# Patient Record
Sex: Female | Born: 1960 | Hispanic: Yes | Marital: Married | Smoking: Former smoker
Health system: Southern US, Community
[De-identification: ages and names within clinical notes are randomized; demographics above are authoritative.]

## PROBLEM LIST (undated history)

## (undated) DIAGNOSIS — F419 Anxiety disorder, unspecified: Secondary | ICD-10-CM

## (undated) DIAGNOSIS — F32A Depression, unspecified: Secondary | ICD-10-CM

## (undated) DIAGNOSIS — R7303 Prediabetes: Secondary | ICD-10-CM

## (undated) DIAGNOSIS — K219 Gastro-esophageal reflux disease without esophagitis: Secondary | ICD-10-CM

## (undated) DIAGNOSIS — E039 Hypothyroidism, unspecified: Secondary | ICD-10-CM

## (undated) DIAGNOSIS — I10 Essential (primary) hypertension: Secondary | ICD-10-CM

## (undated) HISTORY — PX: ABDOMINAL HYSTERECTOMY: SHX81

---

## 2010-09-12 ENCOUNTER — Emergency Department: Payer: Self-pay | Admitting: Emergency Medicine

## 2010-10-17 ENCOUNTER — Emergency Department: Payer: Self-pay | Admitting: Emergency Medicine

## 2011-07-30 ENCOUNTER — Ambulatory Visit: Payer: Self-pay | Admitting: Family Medicine

## 2011-09-27 ENCOUNTER — Emergency Department: Payer: Self-pay | Admitting: Emergency Medicine

## 2011-09-27 LAB — COMPREHENSIVE METABOLIC PANEL
BUN: 21 mg/dL — ABNORMAL HIGH (ref 7–18)
Calcium, Total: 9 mg/dL (ref 8.5–10.1)
Chloride: 104 mmol/L (ref 98–107)
Co2: 27 mmol/L (ref 21–32)
Creatinine: 0.9 mg/dL (ref 0.60–1.30)
EGFR (African American): >60
EGFR (Non-African Amer.): >60
Potassium: 3.5 mmol/L (ref 3.5–5.1)
SGOT(AST): 25 U/L (ref 15–37)
SGPT (ALT): 24 U/L (ref 12–78)
Total Protein: 7.8 g/dL (ref 6.4–8.2)

## 2011-09-27 LAB — CBC
HCT: 35.8 % (ref 35.0–47.0)
HGB: 12.5 g/dL (ref 12.0–16.0)
MCH: 32.1 pg (ref 26.0–34.0)
MCHC: 34.9 g/dL (ref 32.0–36.0)
MCV: 92 fL (ref 80–100)
RBC: 3.88 10*6/uL (ref 3.80–5.20)
RDW: 14.5 % (ref 11.5–14.5)

## 2011-09-28 LAB — TSH: Thyroid Stimulating Horm: 3.72 u[IU]/mL

## 2014-01-26 DIAGNOSIS — W19XXXA Unspecified fall, initial encounter: Secondary | ICD-10-CM | POA: Insufficient documentation

## 2014-01-31 DIAGNOSIS — M65811 Other synovitis and tenosynovitis, right shoulder: Secondary | ICD-10-CM | POA: Insufficient documentation

## 2014-01-31 DIAGNOSIS — M65911 Unspecified synovitis and tenosynovitis, right shoulder: Secondary | ICD-10-CM

## 2014-01-31 HISTORY — DX: Unspecified synovitis and tenosynovitis, right shoulder: M65.911

## 2014-08-15 ENCOUNTER — Ambulatory Visit
Admission: RE | Admit: 2014-08-15 | Discharge: 2014-08-15 | Disposition: A | Discharge status: Home / Self Care | Payer: Self-pay | Source: Ambulatory Visit | Attending: Oncology | Admitting: Oncology

## 2014-08-15 ENCOUNTER — Ambulatory Visit: Payer: Self-pay | Attending: Oncology

## 2014-08-15 VITALS — BP 125/80 | HR 68 | Temp 96.9°F | Resp 18 | Ht 64.17 in | Wt 233.0 lb

## 2014-08-15 DIAGNOSIS — Z0000 Encounter for general adult medical examination without abnormal findings: Secondary | ICD-10-CM

## 2014-08-15 NOTE — Progress Notes (Signed)
Subjective:     Patient ID: Felicia Durham, female   DOB: 12/03/1960, 54 y.o.   MRN: [id number]  HPI   Review of Systems     Objective:   Physical Exam  Pulmonary/Chest: Right breast exhibits no inverted nipple, no mass, no nipple discharge, no skin change and no tenderness. Left breast exhibits no inverted nipple, no mass, no nipple discharge, no skin change and no tenderness. Breasts are symmetrical.       Assessment:     [id number] yraer ol hispanic patient presents for [facility] clinic visit.  Patient screened, and meets [facility] eligibility.  Patient does not have insurance, Medicare or Medicaid.  Handout given on Affordable Care Act.  CBE unremarkable.  Instructed patient on breast self-exam using teach back method.  Patient is self employed Advertising copywriterhousekeeper.  She is applying for job in [place] when she leaves here.     Plan:     Sent for bilateral screening mammogram.  Felicia BellowMaritza Durham interpreted exam.

## 2014-08-16 ENCOUNTER — Other Ambulatory Visit: Payer: Self-pay

## 2014-08-16 DIAGNOSIS — N63 Unspecified lump in unspecified breast: Secondary | ICD-10-CM

## 2014-08-16 NOTE — Progress Notes (Signed)
Screening mammogram Birads 0 possible right breast mass.  Order for right breast diagnostic mammogram with tomosynthesis, and right breast ultrasound entered.

## 2014-08-19 ENCOUNTER — Other Ambulatory Visit: Payer: Self-pay

## 2014-08-19 ENCOUNTER — Ambulatory Visit
Admission: RE | Admit: 2014-08-19 | Discharge: 2014-08-19 | Disposition: A | Discharge status: Home / Self Care | Payer: Self-pay | Source: Ambulatory Visit | Attending: Oncology | Admitting: Oncology

## 2014-08-19 DIAGNOSIS — N63 Unspecified lump in unspecified breast: Secondary | ICD-10-CM

## 2014-08-19 DIAGNOSIS — R928 Other abnormal and inconclusive findings on diagnostic imaging of breast: Secondary | ICD-10-CM | POA: Insufficient documentation

## 2014-08-24 NOTE — Progress Notes (Signed)
Letter mailed from [facility] to notify of normal mammogram results.  Patient to return in one year for annual screening.  Copy to [facility]. 

## 2014-12-15 ENCOUNTER — Encounter
Admission: RE | Admit: 2014-12-15 | Discharge: 2014-12-15 | Disposition: A | Discharge status: Home / Self Care | Payer: Self-pay | Source: Ambulatory Visit | Attending: Surgery | Admitting: Surgery

## 2014-12-15 DIAGNOSIS — Z01810 Encounter for preprocedural cardiovascular examination: Secondary | ICD-10-CM | POA: Insufficient documentation

## 2014-12-15 DIAGNOSIS — Z01812 Encounter for preprocedural laboratory examination: Secondary | ICD-10-CM | POA: Insufficient documentation

## 2014-12-15 HISTORY — DX: Gastro-esophageal reflux disease without esophagitis: K21.9

## 2014-12-15 HISTORY — DX: Anxiety disorder, unspecified: F41.9

## 2014-12-15 HISTORY — DX: Hypothyroidism, unspecified: E03.9

## 2014-12-15 HISTORY — DX: Essential (primary) hypertension: I10

## 2014-12-15 LAB — CBC
HCT: 39.9 % (ref 35.0–47.0)
Hemoglobin: 13.1 g/dL (ref 12.0–16.0)
MCH: 29.5 pg (ref 26.0–34.0)
MCHC: 32.8 g/dL (ref 32.0–36.0)
MCV: 89.8 fL (ref 80.0–100.0)
PLATELETS: 270 10*3/uL (ref 150–440)
RBC: 4.44 MIL/uL (ref 3.80–5.20)
RDW: 13.3 % (ref 11.5–14.5)
WBC: 7.5 10*3/uL (ref 3.6–11.0)

## 2014-12-15 LAB — BASIC METABOLIC PANEL
Anion gap: 7 (ref 5–15)
BUN: 21 mg/dL — AB (ref 6–20)
CALCIUM: 9.3 mg/dL (ref 8.9–10.3)
CO2: 29 mmol/L (ref 22–32)
CREATININE: 0.53 mg/dL (ref 0.44–1.00)
Chloride: 107 mmol/L (ref 101–111)
Glucose, Bld: 96 mg/dL (ref 65–99)
Potassium: 4.1 mmol/L (ref 3.5–5.1)
SODIUM: 143 mmol/L (ref 135–145)

## 2014-12-15 NOTE — Patient Instructions (Signed)
  Your procedure is scheduled on: Tuesday 12/27/2014 Report to Day Surgery. [facility] To find out your arrival time please call [phone] between 1PM - 3PM on Monday 12/26/2014.  Remember: Instructions that are not followed completely may result in serious medical risk, up to and including death, or upon the discretion of your surgeon and anesthesiologist your surgery may need to be rescheduled.    __X__ 1. Do not eat food or drink liquids after midnight. No gum chewing or hard candies.     __X__ 2. No Alcohol for 24 hours before or after surgery.   ____ 3. Bring all medications with you on the day of surgery if instructed.    __X__ 4. Notify your doctor if there is any change in your medical condition     (cold, fever, infections).     Do not wear jewelry, make-up, hairpins, clips or nail polish.  Do not wear lotions, powders, or perfumes.   Do not shave 48 hours prior to surgery. Men may shave face and neck.  Do not bring valuables to the hospital.    [facility] any belongings or valuables.               Contacts, dentures or bridgework may not be worn into surgery.  Leave your suitcase in the car. After surgery it may be brought to your room.  For patients admitted to the hospital, discharge time is determined by your                treatment team.   Patients discharged the day of surgery will not be allowed to drive home.   Please read over the following fact sheets that you were given:   Surgical Site Infection Prevention   __X__ : TAKE THESE MEDICATIONS WITH A SIP OF WATER THE MORNING OF SURGERY  1. LEVOTHYROXINE  2. CITALOPRAM  3.   4.  5.  6.  ____ Fleet Enema (as directed)   __X__ Use CHG Soap as directed  ____ Use inhalers on the day of surgery  ____ Stop metformin 2 days prior to surgery    ____ Take 1/2 of usual insulin dose the night before surgery and none on the morning of surgery.   ____ Stop  Coumadin/Plavix/aspirin on   __X__ Stop Anti-inflammatories on STOP ADVIL TODAY   ____ Stop supplements until after surgery.    ____ Bring C-Pap to the hospital.

## 2014-12-27 ENCOUNTER — Ambulatory Visit
Admission: RE | Admit: 2014-12-27 | Discharge: 2014-12-27 | Disposition: A | Discharge status: Home / Self Care | Payer: Worker's Compensation | Source: Ambulatory Visit | Attending: Surgery | Admitting: Surgery

## 2014-12-27 ENCOUNTER — Ambulatory Visit: Payer: Worker's Compensation | Admitting: Anesthesiology

## 2014-12-27 ENCOUNTER — Encounter
Admission: RE | Disposition: A | Discharge status: Home / Self Care | Payer: Self-pay | Source: Ambulatory Visit | Attending: Surgery

## 2014-12-27 ENCOUNTER — Encounter: Payer: Self-pay | Admitting: *Deleted

## 2014-12-27 DIAGNOSIS — M75101 Unspecified rotator cuff tear or rupture of right shoulder, not specified as traumatic: Secondary | ICD-10-CM | POA: Insufficient documentation

## 2014-12-27 DIAGNOSIS — Z79899 Other long term (current) drug therapy: Secondary | ICD-10-CM | POA: Diagnosis not present

## 2014-12-27 DIAGNOSIS — E039 Hypothyroidism, unspecified: Secondary | ICD-10-CM | POA: Diagnosis not present

## 2014-12-27 DIAGNOSIS — M7541 Impingement syndrome of right shoulder: Secondary | ICD-10-CM | POA: Diagnosis not present

## 2014-12-27 DIAGNOSIS — M7521 Bicipital tendinitis, right shoulder: Secondary | ICD-10-CM | POA: Insufficient documentation

## 2014-12-27 DIAGNOSIS — Z87891 Personal history of nicotine dependence: Secondary | ICD-10-CM | POA: Insufficient documentation

## 2014-12-27 HISTORY — PX: SHOULDER ARTHROSCOPY: SHX128

## 2014-12-27 SURGERY — ARTHROSCOPY, SHOULDER
Anesthesia: Regional | Laterality: Right | Wound class: Clean

## 2014-12-27 MED ORDER — ONDANSETRON HCL 4 MG/2ML IJ SOLN
INTRAMUSCULAR | Status: DC | PRN
  Administered 2014-12-27: 4 mg via INTRAVENOUS

## 2014-12-27 MED ORDER — EPHEDRINE SULFATE 50 MG/ML IJ SOLN
INTRAMUSCULAR | Status: DC | PRN
  Administered 2014-12-27: 10 mg via INTRAVENOUS
  Administered 2014-12-27: 5 mg via INTRAVENOUS

## 2014-12-27 MED ORDER — DEXAMETHASONE SODIUM PHOSPHATE 4 MG/ML IJ SOLN
INTRAMUSCULAR | Status: DC | PRN
  Administered 2014-12-27: 5 mg via INTRAVENOUS

## 2014-12-27 MED ORDER — FENTANYL CITRATE (PF) 100 MCG/2ML IJ SOLN
INTRAMUSCULAR | Status: AC
  Filled 2014-12-27: qty 2

## 2014-12-27 MED ORDER — POTASSIUM CHLORIDE IN NACL 20-0.9 MEQ/L-% IV SOLN: INTRAVENOUS | Status: DC

## 2014-12-27 MED ORDER — LACTATED RINGERS IV SOLN
INTRAVENOUS | Status: DC | PRN
  Administered 2014-12-27: 2 mL

## 2014-12-27 MED ORDER — BUPIVACAINE-EPINEPHRINE (PF) 0.5% -1:200000 IJ SOLN
INTRAMUSCULAR | Status: DC | PRN
  Administered 2014-12-27: 30 mL

## 2014-12-27 MED ORDER — MIDAZOLAM HCL 5 MG/5ML IJ SOLN
INTRAMUSCULAR | Status: AC
  Administered 2014-12-27: 1 mg
  Filled 2014-12-27: qty 5

## 2014-12-27 MED ORDER — FAMOTIDINE 20 MG PO TABS
20.0000 mg | ORAL_TABLET | Freq: Once | ORAL | Status: AC
  Administered 2014-12-27: 20 mg via ORAL

## 2014-12-27 MED ORDER — KETOROLAC TROMETHAMINE 30 MG/ML IJ SOLN
INTRAMUSCULAR | Status: DC | PRN
  Administered 2014-12-27: 30 mg via INTRAVENOUS

## 2014-12-27 MED ORDER — LIDOCAINE HCL (CARDIAC) 20 MG/ML IV SOLN
INTRAVENOUS | Status: DC | PRN
  Administered 2014-12-27: 100 mg via INTRAVENOUS

## 2014-12-27 MED ORDER — METOCLOPRAMIDE HCL 5 MG/ML IJ SOLN: 5.0000 mg | Freq: Three times a day (TID) | INTRAMUSCULAR | Status: DC | PRN

## 2014-12-27 MED ORDER — CEFAZOLIN SODIUM-DEXTROSE 2-3 GM-% IV SOLR
INTRAVENOUS | Status: AC
  Administered 2014-12-27: 2 g via INTRAVENOUS
  Filled 2014-12-27: qty 50

## 2014-12-27 MED ORDER — SUGAMMADEX SODIUM 200 MG/2ML IV SOLN
INTRAVENOUS | Status: DC | PRN
  Administered 2014-12-27: 200 mg via INTRAVENOUS

## 2014-12-27 MED ORDER — ONDANSETRON HCL 4 MG PO TABS: 4.0000 mg | ORAL_TABLET | Freq: Four times a day (QID) | ORAL | Status: DC | PRN

## 2014-12-27 MED ORDER — FENTANYL CITRATE (PF) 100 MCG/2ML IJ SOLN
25.0000 ug | INTRAMUSCULAR | Status: DC | PRN
  Administered 2014-12-27 (×5): 25 ug via INTRAVENOUS

## 2014-12-27 MED ORDER — ACETAMINOPHEN 10 MG/ML IV SOLN
INTRAVENOUS | Status: AC
  Filled 2014-12-27: qty 100

## 2014-12-27 MED ORDER — BUPIVACAINE-EPINEPHRINE (PF) 0.5% -1:200000 IJ SOLN
INTRAMUSCULAR | Status: AC
  Filled 2014-12-27: qty 30

## 2014-12-27 MED ORDER — FENTANYL CITRATE (PF) 100 MCG/2ML IJ SOLN
INTRAMUSCULAR | Status: AC
  Administered 2014-12-27: 50 ug
  Filled 2014-12-27: qty 2

## 2014-12-27 MED ORDER — ACETAMINOPHEN 10 MG/ML IV SOLN
INTRAVENOUS | Status: DC | PRN
  Administered 2014-12-27: 1000 mg via INTRAVENOUS

## 2014-12-27 MED ORDER — OXYCODONE HCL 5 MG PO TABS: 5.0000 mg | ORAL_TABLET | ORAL | Status: DC | PRN

## 2014-12-27 MED ORDER — ROCURONIUM BROMIDE 100 MG/10ML IV SOLN
INTRAVENOUS | Status: DC | PRN
  Administered 2014-12-27: 30 mg via INTRAVENOUS

## 2014-12-27 MED ORDER — ONDANSETRON HCL 4 MG/2ML IJ SOLN: 4.0000 mg | Freq: Once | INTRAMUSCULAR | Status: DC | PRN

## 2014-12-27 MED ORDER — EPINEPHRINE HCL 1 MG/ML IJ SOLN
INTRAMUSCULAR | Status: AC
  Filled 2014-12-27: qty 2

## 2014-12-27 MED ORDER — PROPOFOL 10 MG/ML IV BOLUS
INTRAVENOUS | Status: DC | PRN
  Administered 2014-12-27: 150 mg via INTRAVENOUS

## 2014-12-27 MED ORDER — METOCLOPRAMIDE HCL 10 MG PO TABS: 5.0000 mg | ORAL_TABLET | Freq: Three times a day (TID) | ORAL | Status: DC | PRN

## 2014-12-27 MED ORDER — FAMOTIDINE 20 MG PO TABS
ORAL_TABLET | ORAL | Status: AC
  Filled 2014-12-27: qty 1

## 2014-12-27 MED ORDER — DEXTROSE 5 % IV SOLN: 2000.0000 mg | Freq: Once | INTRAVENOUS | Status: DC

## 2014-12-27 MED ORDER — FENTANYL CITRATE (PF) 100 MCG/2ML IJ SOLN: 50.0000 ug | Freq: Once | INTRAMUSCULAR | Status: DC

## 2014-12-27 MED ORDER — OXYCODONE HCL 5 MG PO TABS
5.0000 mg | ORAL_TABLET | ORAL | Status: DC | PRN
  Administered 2014-12-27: 5 mg via ORAL

## 2014-12-27 MED ORDER — FENTANYL CITRATE (PF) 100 MCG/2ML IJ SOLN
INTRAMUSCULAR | Status: DC | PRN
  Administered 2014-12-27: 50 ug via INTRAVENOUS

## 2014-12-27 MED ORDER — CEFAZOLIN SODIUM-DEXTROSE 2-3 GM-% IV SOLR: 2.0000 g | Freq: Once | INTRAVENOUS | Status: DC

## 2014-12-27 MED ORDER — FENTANYL CITRATE (PF) 100 MCG/2ML IJ SOLN
INTRAMUSCULAR | Status: AC
  Administered 2014-12-27: 25 ug via INTRAVENOUS
  Filled 2014-12-27: qty 2

## 2014-12-27 MED ORDER — OXYCODONE HCL 5 MG PO TABS
ORAL_TABLET | ORAL | Status: AC
  Filled 2014-12-27: qty 1

## 2014-12-27 MED ORDER — ONDANSETRON HCL 4 MG/2ML IJ SOLN: 4.0000 mg | Freq: Four times a day (QID) | INTRAMUSCULAR | Status: DC | PRN

## 2014-12-27 MED ORDER — LACTATED RINGERS IV SOLN
INTRAVENOUS | Status: DC
  Administered 2014-12-27: 12:00:00 via INTRAVENOUS

## 2014-12-27 SURGICAL SUPPLY — 50 items
ANCHOR JUGGERKNOT WTAP NDL 2.9 (Anchor) ×3 IMPLANT
BIT DRILL JUGRKNT W/NDL BIT2.9 (DRILL) ×1 IMPLANT
BLADE FULL RADIUS 3.5 (BLADE) ×3 IMPLANT
BLADE SHAVER 4.5X7 STR FR (MISCELLANEOUS) IMPLANT
BUR ACROMIONIZER 4.0 (BURR) ×3 IMPLANT
BUR BR 5.5 WIDE MOUTH (BURR) IMPLANT
CANNULA 8.5X75 THRED (CANNULA) IMPLANT
CANNULA SHAVER 8MMX76MM (CANNULA) ×3 IMPLANT
CHLORAPREP W/TINT 26ML (MISCELLANEOUS) ×6 IMPLANT
DRAPE IMP U-DRAPE 54X76 (DRAPES) ×6 IMPLANT
DRAPE SURG 17X11 SM STRL (DRAPES) ×3 IMPLANT
DRILL JUGGERKNOT W/NDL BIT 2.9 (DRILL) ×3
DRSG OPSITE POSTOP 4X8 (GAUZE/BANDAGES/DRESSINGS) IMPLANT
GAUZE PETRO XEROFOAM 1X8 (MISCELLANEOUS) ×3 IMPLANT
GAUZE SPONGE 4X4 12PLY STRL (GAUZE/BANDAGES/DRESSINGS) ×3 IMPLANT
GLOVE BIO SURGEON STRL SZ7.5 (GLOVE) ×6 IMPLANT
GLOVE BIO SURGEON STRL SZ8 (GLOVE) ×6 IMPLANT
GLOVE BIOGEL PI IND STRL 8 (GLOVE) ×1 IMPLANT
GLOVE BIOGEL PI INDICATOR 8 (GLOVE) ×2
GLOVE INDICATOR 8.0 STRL GRN (GLOVE) ×3 IMPLANT
GOWN STRL REUS W/ TWL LRG LVL3 (GOWN DISPOSABLE) ×2 IMPLANT
GOWN STRL REUS W/ TWL XL LVL3 (GOWN DISPOSABLE) ×1 IMPLANT
GOWN STRL REUS W/TWL LRG LVL3 (GOWN DISPOSABLE) ×4
GOWN STRL REUS W/TWL XL LVL3 (GOWN DISPOSABLE) ×2
GRASPER SUT 15 45D LOW PRO (SUTURE) ×6 IMPLANT
IV LACTATED RINGER IRRG 3000ML (IV SOLUTION) ×4
IV LR IRRIG 3000ML ARTHROMATIC (IV SOLUTION) ×2 IMPLANT
MANIFOLD NEPTUNE II (INSTRUMENTS) ×3 IMPLANT
MASK FACE SPIDER DISP (MASK) ×3 IMPLANT
MAT BLUE FLOOR 46X72 FLO (MISCELLANEOUS) ×3 IMPLANT
NDL MAYO CATGUT SZ4 (NEEDLE) ×3 IMPLANT
NEEDLE MAYO 6 CRC TAPER PT (NEEDLE) ×3 IMPLANT
NEEDLE MAYO CATGUT SZ 1.5 (NEEDLE) ×3
NEEDLE MAYO CATGUT SZ 2 (NEEDLE) ×1 IMPLANT
NEEDLE REVERSE CUT 1/2 CRC (NEEDLE) ×3 IMPLANT
PACK ARTHROSCOPY SHOULDER (MISCELLANEOUS) ×3 IMPLANT
PAD GROUND ADULT SPLIT (MISCELLANEOUS) ×3 IMPLANT
SLING ARM LRG DEEP (SOFTGOODS) ×3 IMPLANT
SLING ULTRA II LG (MISCELLANEOUS) IMPLANT
STAPLER SKIN PROX 35W (STAPLE) ×3 IMPLANT
STRAP SAFETY BODY (MISCELLANEOUS) ×3 IMPLANT
SUT ETHIBOND 0 MO6 C/R (SUTURE) ×3 IMPLANT
SUT PROLENE 4 0 PS 2 18 (SUTURE) ×3 IMPLANT
SUT VIC AB 2-0 CT1 27 (SUTURE) ×4
SUT VIC AB 2-0 CT1 TAPERPNT 27 (SUTURE) ×2 IMPLANT
TAPE MICROFOAM 4IN (TAPE) ×3 IMPLANT
TUBING ARTHRO INFLOW-ONLY STRL (TUBING) ×3 IMPLANT
TUBING CONNECTING 10 (TUBING) ×2 IMPLANT
TUBING CONNECTING 10' (TUBING) ×1
WAND HAND CNTRL MULTIVAC 90 (MISCELLANEOUS) ×3 IMPLANT

## 2014-12-27 NOTE — Op Note (Signed)
12/27/2014  3:18 PM  Patient:   Felicia Durham  Pre-Op Diagnosis:   Impingement/tendinopathy with possible partial thickness rotator cuff tear, right shoulder.  Postoperative diagnosis: Impingement/tendinopathy with partial-thickness rotator cuff tear and biceps tendinopathy, right shoulder.  Procedure: Extensive arthroscopic debridement, arthroscopic subacromial decompression, and mini-open biceps tenodesis, right shoulder.  Anesthesia: General endotracheal with interscalene block placed preoperatively by the anesthesiologist.  Surgeon:   Maryagnes Amos, MD  Assistant:   Julieta Bellini, PA-S  Findings: As above. There was some fraying of the superior labrum, as well as moderate tendinopathic changes of the biceps tendon. There was a partial-thickness tear of the articular side of the anterior insertional fibers of the supraspinatus involving approximately 25% with of the footprint. The remaining portions of the rotator cuff forward satisfactory condition. The articular surfaces of the glenoid labrum both were in satisfactory condition.  Complications: None  Fluids:   800 cc  Estimated blood loss: 10 cc  Tourniquet time: None  Drains: None  Closure: Staples   Brief clinical note: The patient is a 54 year old female who is approximately one year status post a slip and fall injury to her right shoulder. The patient's symptoms have progressed despite medications, activity modification, etc. The patient's history and examination are consistent with impingement/tendinopathy with a possible partial thickness rotator cuff tear as demonstrated by MRI scan. The patient presents at this time for definitive management of these shoulder symptoms.  Procedure: The patient underwent placement of an interscalene block by the anesthesiologist in the preoperative holding area before she was brought into the operating room and lain in the supine position. The patient then  underwent general endotracheal intubation and anesthesia before being repositioned in the beach chair position using the beach chair positioner. The right shoulder and upper extremity were prepped with ChloraPrep solution before being draped sterilely. Preoperative antibiotics were administered. A timeout was performed to confirm the proper surgical site before the expected portal sites and incision site were injected with 0.5% Sensorcaine with epinephrine. A posterior portal was created and the glenohumeral joint thoroughly inspected with the findings as described above. An anterior portal was created using an outside-in technique. The labrum and rotator cuff were further probed, again confirming the above-noted findings. The areas of labral fraying superiorly and anteriorly with debridement back to stable margins using the full-radius resector. Areas of extensive synovitis anteriorly and superiorly were debrided back to stable margins using the full-radius resector as well. The biceps tendon was pulled into the joint with the probe where it demonstrated moderate tendinopathic changes. Therefore, it was elected to perform a biceps tenodesis. The ArthroCare wand was inserted and used to release the tendon from its labral attachment. The ArthroCare wand also was used to obtain hemostasis, as well as to "anneal" the labrum superiorly and anteriorly. The instruments were removed from the joint after suctioning the excess fluid.  The camera was repositioned through the posterior portal into the subacromial space. A separate lateral portal was created using an outside-in technique. The 3.5 mm full-radius resector was introduced and used to perform a subtotal bursectomy. The ArthroCare wand was then inserted and used to remove the periosteal tissue off the undersurface of the anterior third of the acromion as well as to recess the coracoacromial ligament from its attachment along the anterior and lateral margins of the  acromion. The 4.0 mm acromionizing bur was introduced and used to complete the decompression by removing the undersurface of the anterior third of the acromion. The full radius  resector was reintroduced to remove any residual bony debris before the ArthroCare wand was reintroduced to obtain hemostasis. The instruments were then removed from the subacromial space after suctioning the excess fluid.  An approximately 4-5 cm incision was made over the anterolateral aspect of the shoulder beginning at the anterolateral corner of the acromion and extending distally in line with the bicipital groove. This incision was carried down through the subcutaneous tissues to expose the deltoid fascia. The raphae between the anterior and middle thirds was identified and this plane developed to provide access into the subacromial space. Additional bursal tissues were debrided sharply using Metzenbaum scissors. The rotator cuff tendon was readily identified and inspected carefully. There was no evidence of any tendon pathology on the bursal side. The bicipital groove was identified by palpation and opened for 1-1.5 cm. The biceps tendon stump was retrieved through this defect. The floor of the bicipital groove was roughened with a curet before a Biomet 2.9 mm JuggerKnot anchor was inserted. Both sets of sutures were passed through the biceps tendon and tied securely to effect the tenodesis. The bicipital sheath was reapproximated using two #0 Ethibond interrupted sutures, incorporating the biceps tendon to further reinforce the tenodesis.  The wound was copiously irrigated with sterile saline solution before the deltoid raphae was reapproximated using 2-0 Vicryl interrupted sutures. The subcutaneous tissues were closed in two layers using 2-0 Vicryl interrupted sutures before the skin was closed using staples. The portal sites also were closed using staples. A sterile bulky dressing was applied to the shoulder before the arm was  placed into a shoulder immobilizer. The patient was then awakened, extubated, and returned to the recovery room in satisfactory condition after tolerating the procedure well.

## 2014-12-27 NOTE — Anesthesia Preprocedure Evaluation (Signed)
Anesthesia Evaluation  Patient identified by MRN, date of birth, ID band Patient awake    Reviewed: Allergy & Precautions, NPO status , Patient's Chart, lab work & pertinent test results, reviewed documented beta blocker date and time   Airway Mallampati: II  TM Distance: >3 FB     Dental  (+) Chipped   Pulmonary Current Smoker,           Cardiovascular hypertension, Pt. on medications      Neuro/Psych Anxiety    GI/Hepatic GERD  ,  Endo/Other  Hypothyroidism   Renal/GU      Musculoskeletal   Abdominal   Peds  Hematology   Anesthesia Other Findings Translator.  Reproductive/Obstetrics                             Anesthesia Physical Anesthesia Plan  ASA: II  Anesthesia Plan: General and Regional   Post-op Pain Management:    Induction: Intravenous  Airway Management Planned: Oral ETT  Additional Equipment:   Intra-op Plan:   Post-operative Plan:   Informed Consent: I have reviewed the patients History and Physical, chart, labs and discussed the procedure including the risks, benefits and alternatives for the proposed anesthesia with the patient or authorized representative who has indicated his/her understanding and acceptance.     Plan Discussed with: CRNA  Anesthesia Plan Comments:         Anesthesia Quick Evaluation

## 2014-12-27 NOTE — H&P (Signed)
Paper H&P to be scanned into permanent record. H&P reviewed. No changes. 

## 2014-12-27 NOTE — Transfer of Care (Signed)
Immediate Anesthesia Transfer of Care Note  Patient: Felicia Durham  Procedure(s) Performed: Procedure(s): Shoulder Arthroscopy, decompression, debridement, rotator cuff repair (Right)  Patient Location: PACU  Anesthesia Type:General  Level of Consciousness: sedated  Airway & Oxygen Therapy: Patient Spontanous Breathing and Patient connected to face mask oxygen  Post-op Assessment: Report given to RN and Post -op Vital signs reviewed and stable  Post vital signs: Reviewed and stable  Last Vitals:  Filed Vitals:   12/27/14 1230 12/27/14 1240  BP: 136/82 126/81  Pulse: 86 81  Temp:    Resp: 20 16    Complications: No apparent anesthesia complications

## 2014-12-27 NOTE — Progress Notes (Signed)
Taken to PACU via stretcher 1210 pm  For block - report given to [facility] sling (pt brought in with her) - taken to PACU with patient

## 2014-12-27 NOTE — Discharge Instructions (Addendum)
Keep dressing dry and intact.  °May shower after dressing changed on post-op day #4 (Saturday).  °Cover staples with Band-Aids after drying off. °Apply ice frequently to shoulder. °Keep shoulder immobilizer on at all times except may remove for bathing purposes. °Follow-up in 10-14 days or as scheduled.AMBULATORY SURGERY  °DISCHARGE INSTRUCTIONS ° ° °1) The drugs that you were given will stay in your system until tomorrow so for the next 24 hours you should not: ° °A) Drive an automobile °B) Make any legal decisions °C) Drink any alcoholic beverage ° ° °2) You may resume regular meals tomorrow.  Today it is better to start with liquids and gradually work up to solid foods. ° °You may eat anything you prefer, but it is better to start with liquids, then soup and crackers, and gradually work up to solid foods. ° ° °3) Please notify your doctor immediately if you have any unusual bleeding, trouble breathing, redness and pain at the surgery site, drainage, fever, or pain not relieved by medication. ° ° ° °4) Additional Instructions: ° ° ° ° ° ° ° °Please contact your physician with any problems or Same Day Surgery at [phone], Monday through Friday 6 am to 4 pm, or [facility] number at [phone]. °

## 2014-12-27 NOTE — Progress Notes (Signed)
Report  Given to Consuella Loseosemary Dennis, RN

## 2014-12-27 NOTE — Anesthesia Postprocedure Evaluation (Signed)
Anesthesia Post Note  Patient: Felicia Durham  Procedure(s) Performed: Procedure(s) (LRB): Shoulder Arthroscopy, decompression, debridement, rotator cuff repair (Right)  Patient location during evaluation: PACU Anesthesia Type: General Level of consciousness: awake and alert Pain management: pain level controlled Vital Signs Assessment: post-procedure vital signs reviewed and stable Respiratory status: spontaneous breathing, nonlabored ventilation, respiratory function stable and patient connected to nasal cannula oxygen Cardiovascular status: blood pressure returned to baseline and stable Postop Assessment: no signs of nausea or vomiting Anesthetic complications: no    Last Vitals:  Filed Vitals:   12/27/14 1549 12/27/14 1604  BP: 148/86 135/78  Pulse: 97 100  Temp:    Resp: 18 15    Last Pain:  Filed Vitals:   12/27/14 1608  PainSc: 5                  Elzena Muston K

## 2014-12-27 NOTE — Progress Notes (Signed)
Interpreter Maritza present preop for admission, anesthesia vist, and accompanied pt to PACU for block

## 2014-12-27 NOTE — Anesthesia Procedure Notes (Addendum)
Anesthesia Regional Block:  Interscalene brachial plexus block  Pre-Anesthetic Checklist: ,, timeout performed, Correct Patient, Correct Site, Correct Laterality, Correct Procedure, Correct Position, site marked, Risks and benefits discussed,  Surgical consent,  Pre-op evaluation,  At surgeon's request and post-op pain management   Prep: Betadine       Needles:  Injection technique: Single-shot  Needle Type: Echogenic Stimulator Needle     Needle Length: 5cm 5 cm Needle Gauge: 21 and 21 G    Additional Needles:  Procedures: ultrasound guided (picture in chart) and nerve stimulator Interscalene brachial plexus block  Nerve Stimulator or Paresthesia:  Response: biceps flexion, 0.8 mA,   Additional Responses:   Narrative:  Injection made incrementally with aspirations every 5 mL.  Performed by: Personally  Anesthesiologist: Berdine AddisonHOMAS, MATHAI  Additional Notes: Functioning IV was confirmed and monitors were applied.  A 50mm 22ga Arrow echogenic stimulator needle was used. Sterile prep and drape,hand hygiene and sterile gloves were used.  Negative aspiration and negative test dose prior to incremental administration of local anesthetic. The patient tolerated the procedure well.  Ultrasound guidance: relevent anatomy identified, needle position confirmed, local anesthetic spread visualized around nerve(s), vascular puncture avoided.  Image printed for medical record. 25ml of .25% marcaine.   Performed by: Malva CoganBEANE, Narjis Mira Pre-anesthesia Checklist: Patient identified, Emergency Drugs available, Suction available, Patient being monitored and Timeout performed Patient Re-evaluated:Patient Re-evaluated prior to inductionOxygen Delivery Method: Circle system utilized Preoxygenation: Pre-oxygenation with 100% oxygen Intubation Type: IV induction Ventilation: Mask ventilation without difficulty Laryngoscope Size: Mac and 3 Grade View: Grade I Tube type: Oral Tube size: 7.0 mm Number  of attempts: 1 Airway Equipment and Method: Stylet Placement Confirmation: ETT inserted through vocal cords under direct vision,  positive ETCO2,  CO2 detector and breath sounds checked- equal and bilateral Secured at: 21 cm Tube secured with: Tape

## 2014-12-28 ENCOUNTER — Encounter: Payer: Self-pay | Admitting: Surgery

## 2014-12-30 DIAGNOSIS — M65911 Unspecified synovitis and tenosynovitis, right shoulder: Secondary | ICD-10-CM | POA: Insufficient documentation

## 2014-12-30 DIAGNOSIS — M65811 Other synovitis and tenosynovitis, right shoulder: Secondary | ICD-10-CM | POA: Insufficient documentation

## 2014-12-30 DIAGNOSIS — S46101A Unspecified injury of muscle, fascia and tendon of long head of biceps, right arm, initial encounter: Secondary | ICD-10-CM | POA: Insufficient documentation

## 2014-12-30 HISTORY — DX: Unspecified synovitis and tenosynovitis, right shoulder: M65.911

## 2015-09-14 ENCOUNTER — Other Ambulatory Visit: Payer: Self-pay | Admitting: Otolaryngology

## 2015-09-14 DIAGNOSIS — H9041 Sensorineural hearing loss, unilateral, right ear, with unrestricted hearing on the contralateral side: Secondary | ICD-10-CM

## 2015-09-26 ENCOUNTER — Ambulatory Visit
Admission: RE | Admit: 2015-09-26 | Discharge: 2015-09-26 | Disposition: A | Discharge status: Home / Self Care | Payer: Self-pay | Source: Ambulatory Visit | Attending: Otolaryngology | Admitting: Otolaryngology

## 2015-09-26 DIAGNOSIS — H9041 Sensorineural hearing loss, unilateral, right ear, with unrestricted hearing on the contralateral side: Secondary | ICD-10-CM | POA: Insufficient documentation

## 2015-09-26 MED ORDER — GADOBENATE DIMEGLUMINE 529 MG/ML IV SOLN
20.0000 mL | Freq: Once | INTRAVENOUS | Status: AC | PRN
  Administered 2015-09-26: 20 mL via INTRAVENOUS

## 2016-03-28 DIAGNOSIS — F324 Major depressive disorder, single episode, in partial remission: Secondary | ICD-10-CM | POA: Diagnosis not present

## 2016-03-28 DIAGNOSIS — E039 Hypothyroidism, unspecified: Secondary | ICD-10-CM | POA: Diagnosis not present

## 2016-03-28 DIAGNOSIS — I10 Essential (primary) hypertension: Secondary | ICD-10-CM | POA: Insufficient documentation

## 2016-03-28 DIAGNOSIS — F419 Anxiety disorder, unspecified: Secondary | ICD-10-CM | POA: Diagnosis not present

## 2016-03-28 DIAGNOSIS — Z131 Encounter for screening for diabetes mellitus: Secondary | ICD-10-CM | POA: Diagnosis not present

## 2016-03-28 DIAGNOSIS — E236 Other disorders of pituitary gland: Secondary | ICD-10-CM | POA: Insufficient documentation

## 2016-05-15 DIAGNOSIS — E039 Hypothyroidism, unspecified: Secondary | ICD-10-CM | POA: Diagnosis not present

## 2016-05-15 DIAGNOSIS — F419 Anxiety disorder, unspecified: Secondary | ICD-10-CM | POA: Diagnosis not present

## 2016-05-15 DIAGNOSIS — R3129 Other microscopic hematuria: Secondary | ICD-10-CM | POA: Diagnosis not present

## 2016-05-15 DIAGNOSIS — R7303 Prediabetes: Secondary | ICD-10-CM | POA: Insufficient documentation

## 2016-05-15 DIAGNOSIS — I10 Essential (primary) hypertension: Secondary | ICD-10-CM | POA: Diagnosis not present

## 2016-05-15 DIAGNOSIS — F324 Major depressive disorder, single episode, in partial remission: Secondary | ICD-10-CM | POA: Diagnosis not present

## 2016-05-16 DIAGNOSIS — E236 Other disorders of pituitary gland: Secondary | ICD-10-CM | POA: Diagnosis not present

## 2016-05-16 DIAGNOSIS — R51 Headache: Secondary | ICD-10-CM | POA: Diagnosis not present

## 2016-05-16 DIAGNOSIS — R5381 Other malaise: Secondary | ICD-10-CM | POA: Diagnosis not present

## 2016-05-16 DIAGNOSIS — R9082 White matter disease, unspecified: Secondary | ICD-10-CM | POA: Diagnosis not present

## 2016-05-17 DIAGNOSIS — R51 Headache: Secondary | ICD-10-CM | POA: Diagnosis not present

## 2016-05-17 DIAGNOSIS — R5381 Other malaise: Secondary | ICD-10-CM | POA: Diagnosis not present

## 2016-05-17 DIAGNOSIS — E236 Other disorders of pituitary gland: Secondary | ICD-10-CM | POA: Diagnosis not present

## 2016-05-17 DIAGNOSIS — R5383 Other fatigue: Secondary | ICD-10-CM | POA: Diagnosis not present

## 2016-05-18 DIAGNOSIS — R3129 Other microscopic hematuria: Secondary | ICD-10-CM | POA: Insufficient documentation

## 2016-08-05 DIAGNOSIS — E236 Other disorders of pituitary gland: Secondary | ICD-10-CM | POA: Diagnosis not present

## 2016-08-05 DIAGNOSIS — R9082 White matter disease, unspecified: Secondary | ICD-10-CM | POA: Diagnosis not present

## 2016-08-05 DIAGNOSIS — R51 Headache: Secondary | ICD-10-CM | POA: Diagnosis not present

## 2016-08-15 DIAGNOSIS — R51 Headache: Secondary | ICD-10-CM | POA: Diagnosis not present

## 2016-08-15 DIAGNOSIS — B351 Tinea unguium: Secondary | ICD-10-CM | POA: Diagnosis not present

## 2016-08-20 DIAGNOSIS — E039 Hypothyroidism, unspecified: Secondary | ICD-10-CM | POA: Diagnosis not present

## 2016-08-20 DIAGNOSIS — I10 Essential (primary) hypertension: Secondary | ICD-10-CM | POA: Diagnosis not present

## 2016-08-20 DIAGNOSIS — F325 Major depressive disorder, single episode, in full remission: Secondary | ICD-10-CM | POA: Diagnosis not present

## 2016-08-20 DIAGNOSIS — J3089 Other allergic rhinitis: Secondary | ICD-10-CM | POA: Diagnosis not present

## 2016-08-23 DIAGNOSIS — R51 Headache: Secondary | ICD-10-CM | POA: Diagnosis not present

## 2016-08-27 ENCOUNTER — Ambulatory Visit: Payer: 59 | Attending: Neurology

## 2016-08-27 DIAGNOSIS — E669 Obesity, unspecified: Secondary | ICD-10-CM | POA: Insufficient documentation

## 2016-08-27 DIAGNOSIS — R400 Somnolence: Secondary | ICD-10-CM | POA: Diagnosis not present

## 2016-08-27 DIAGNOSIS — G4733 Obstructive sleep apnea (adult) (pediatric): Secondary | ICD-10-CM | POA: Insufficient documentation

## 2016-08-27 DIAGNOSIS — R0683 Snoring: Secondary | ICD-10-CM | POA: Diagnosis not present

## 2016-08-29 DIAGNOSIS — G4733 Obstructive sleep apnea (adult) (pediatric): Secondary | ICD-10-CM | POA: Diagnosis not present

## 2016-08-29 DIAGNOSIS — R51 Headache: Secondary | ICD-10-CM | POA: Diagnosis not present

## 2016-11-20 DIAGNOSIS — F321 Major depressive disorder, single episode, moderate: Secondary | ICD-10-CM | POA: Diagnosis not present

## 2016-11-20 DIAGNOSIS — E039 Hypothyroidism, unspecified: Secondary | ICD-10-CM | POA: Diagnosis not present

## 2016-11-20 DIAGNOSIS — I10 Essential (primary) hypertension: Secondary | ICD-10-CM | POA: Diagnosis not present

## 2016-11-20 DIAGNOSIS — F419 Anxiety disorder, unspecified: Secondary | ICD-10-CM | POA: Diagnosis not present

## 2016-12-02 ENCOUNTER — Other Ambulatory Visit: Payer: Self-pay | Admitting: Family Medicine

## 2016-12-02 DIAGNOSIS — Z1159 Encounter for screening for other viral diseases: Secondary | ICD-10-CM | POA: Diagnosis not present

## 2016-12-02 DIAGNOSIS — E039 Hypothyroidism, unspecified: Secondary | ICD-10-CM | POA: Diagnosis not present

## 2016-12-02 DIAGNOSIS — Z1231 Encounter for screening mammogram for malignant neoplasm of breast: Secondary | ICD-10-CM

## 2016-12-02 DIAGNOSIS — Z23 Encounter for immunization: Secondary | ICD-10-CM | POA: Diagnosis not present

## 2016-12-02 DIAGNOSIS — E782 Mixed hyperlipidemia: Secondary | ICD-10-CM | POA: Diagnosis not present

## 2016-12-02 DIAGNOSIS — Z1389 Encounter for screening for other disorder: Secondary | ICD-10-CM | POA: Diagnosis not present

## 2017-03-31 IMAGING — MR MR HEAD WO/W CM
10 of 11 series · 40 of 48 positions shown · IV contrast (multihance)
Comparison: Head CT without contrast 09/12/2010.

CLINICAL DATA: 55-year-old female with right sensory neuro hearing
loss. Patient denies headache or dizziness. Initial encounter.

EXAM:
MRI HEAD WITHOUT AND WITH CONTRAST
TECHNIQUE: Multiplanar, multiecho pulse sequences of the brain and surrounding
structures were obtained without and with intravenous contrast.
CONTRAST:  20mL MULTIHANCE GADOBENATE DIMEGLUMINE 529 MG/ML IV SOLN

[Series 2: T1 · sagittal · 5.0mm · 0.45mm/px · 4 of 23 slices shown (1 of 3)]
[im 1/23]
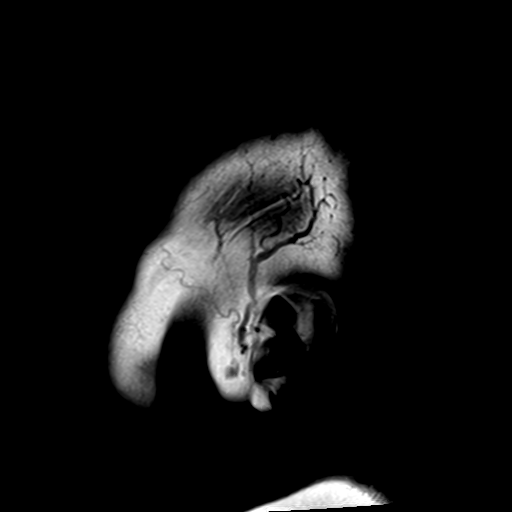
[im 8/23]
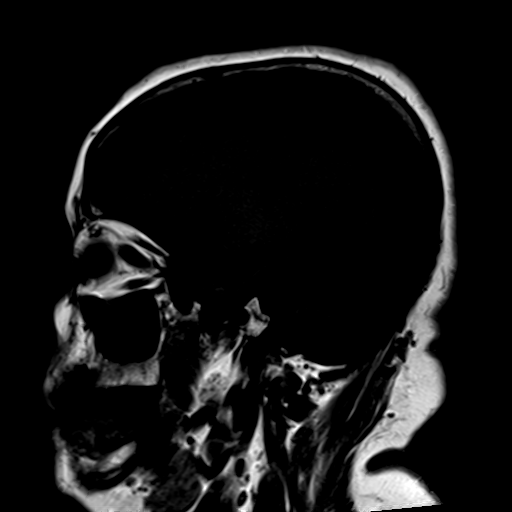
[im 15/23]
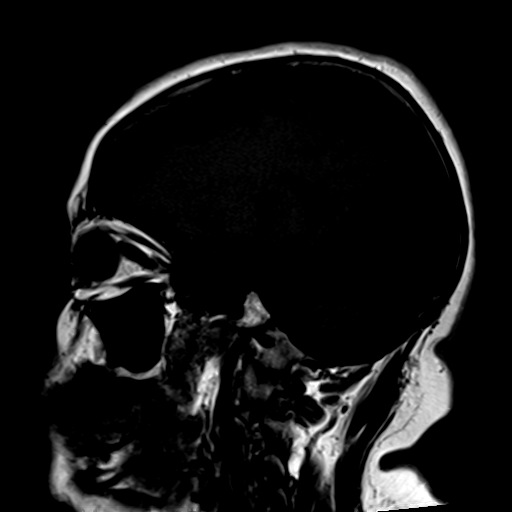
[im 23/23]
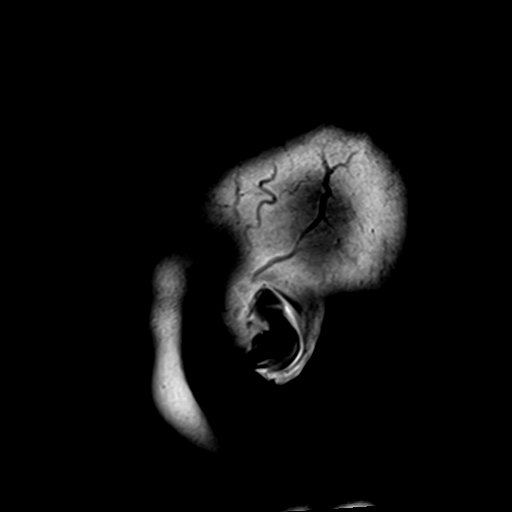

[Series 4: DWI · axial · 3.0mm · 1.20mm/px · z∈[-69,+93]mm · 8 of 55 slices shown (1 of 2)]
[im 1/55]
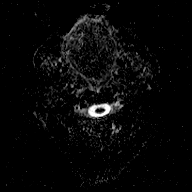
[im 8/55]
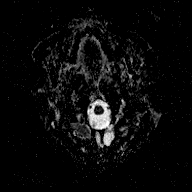
[im 16/55]
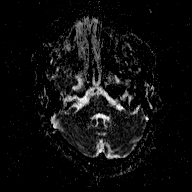
[im 24/55]
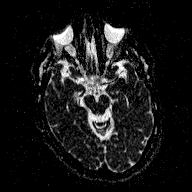
[im 31/55]
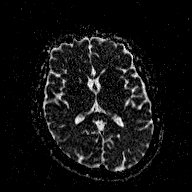
[im 39/55]
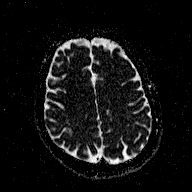
[im 47/55]
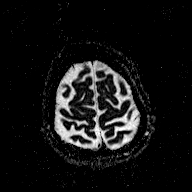
[im 55/55]
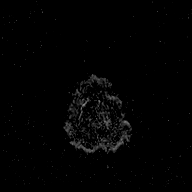

[Series 5: T2 · axial · 5.0mm · 0.72mm/px · z∈[-69,+93]mm · 4 of 26 slices shown]
[im 1/26]
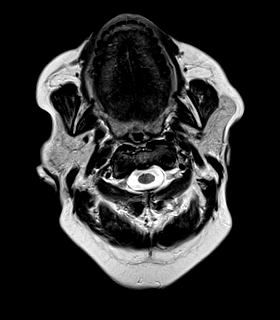
[im 9/26]
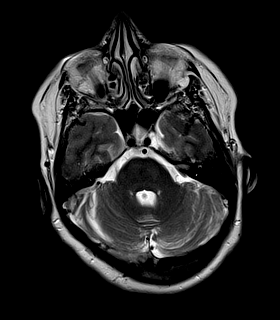
[im 17/26]
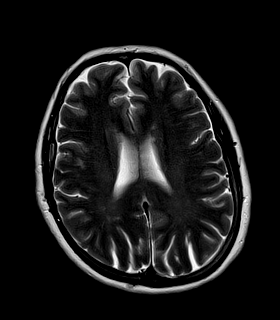
[im 26/26]
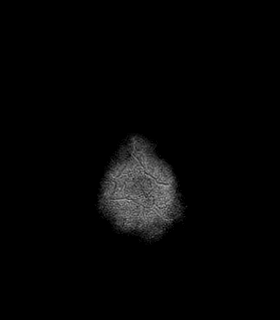

[Series 6: FLAIR · axial · 5.0mm · 0.45mm/px · z∈[-69,+93]mm · 4 of 26 slices shown]
[im 1/26]
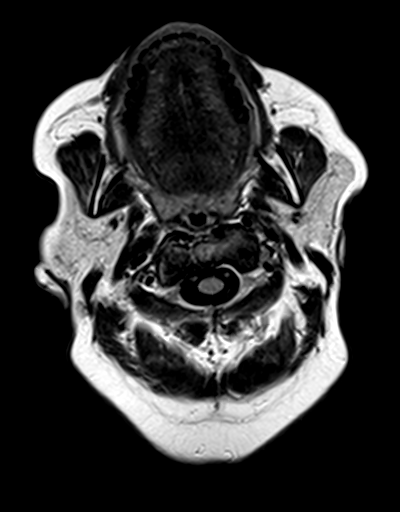
[im 9/26]
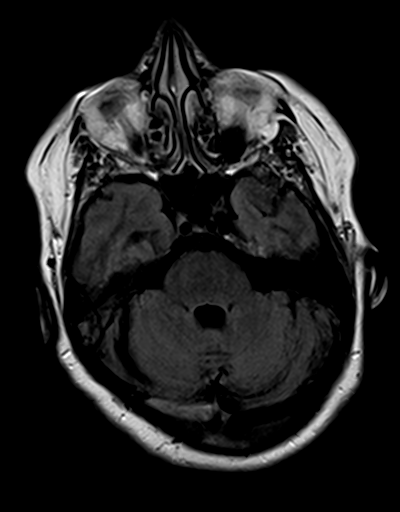
[im 17/26]
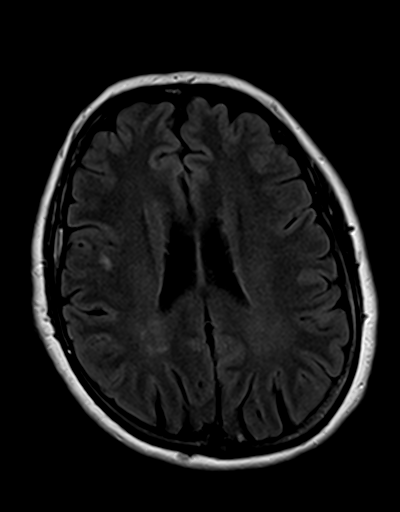
[im 26/26]
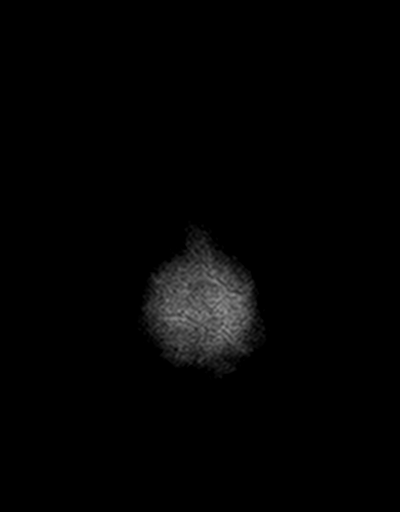

[Series 7: T1 · coronal · 3.0mm · 0.37mm/px · 1 of 11 slices shown (2 of 3)]
[im 1/11]
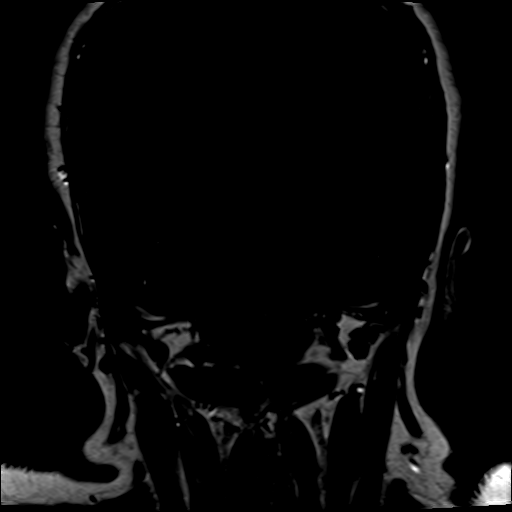

[Series 8: T1 · axial · 3.0mm · 0.37mm/px · 1 of 11 slices shown (3 of 3)]
[im 1/11]
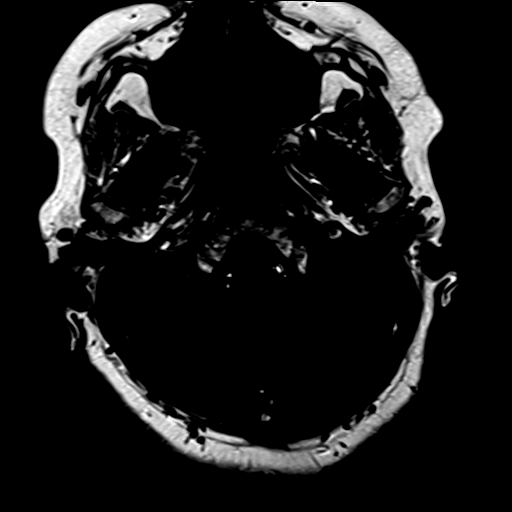

[Series 10: T1 post-contrast · axial · 3.0mm · 0.37mm/px · 1 of 11 slices shown (1 of 3)]
[im 1/11]
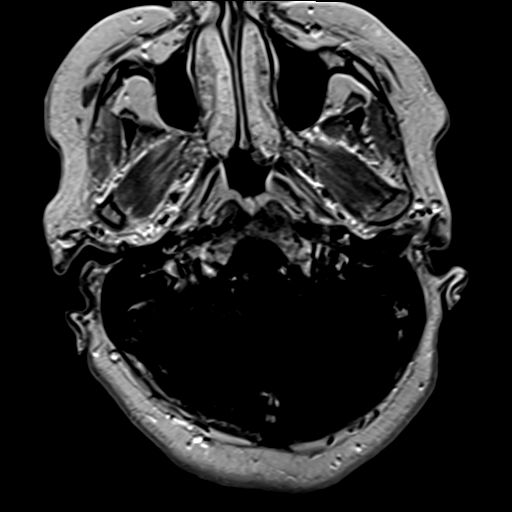

[Series 11: T1 post-contrast · coronal · 3.0mm · 0.37mm/px · 1 of 11 slices shown (2 of 3)]
[im 1/11]
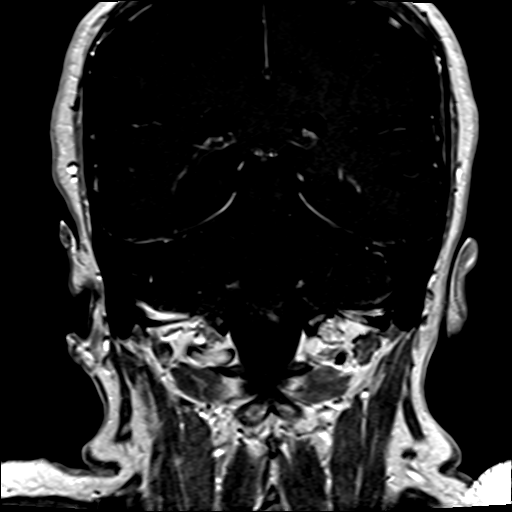

[Series 12: T1 post-contrast · axial · 3.0mm · 1.00mm/px · z∈[-82,+107]mm · 9 of 64 slices shown (3 of 3)]
[im 1/64]
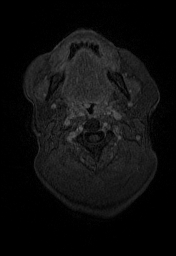
[im 8/64]
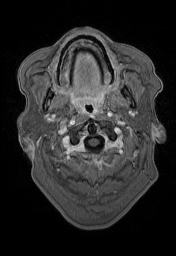
[im 16/64]
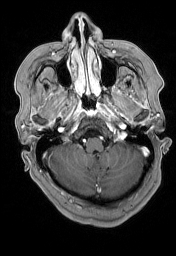
[im 24/64]
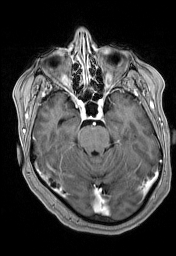
[im 32/64]
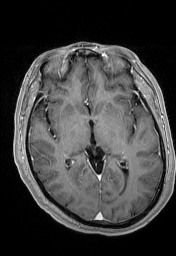
[im 40/64]
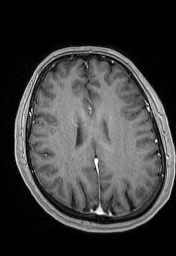
[im 48/64]
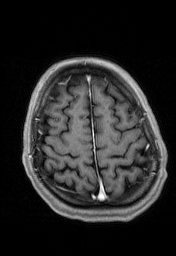
[im 56/64]
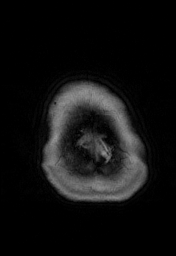
[im 64/64]
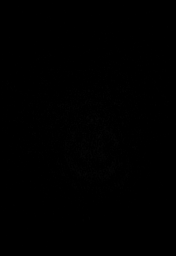

[Series 100: DWI · axial · 3.0mm · 1.20mm/px · z∈[-69,+93]mm · 7 of 55 slices shown (2 of 2)]
[im 1/55]
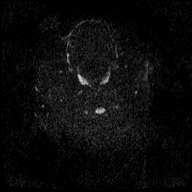
[im 10/55]
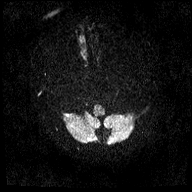
[im 19/55]
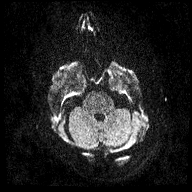
[im 28/55]
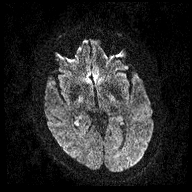
[im 37/55]
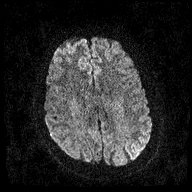
[im 46/55]
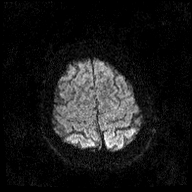
[im 55/55]
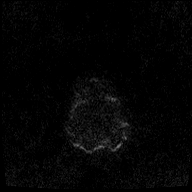

[40 of 48 positions shown; findings below may reference images not displayed]

FINDINGS: Chronic partially empty sella appearance (series 2, image 12). No
restricted diffusion to suggest acute infarction. No midline shift,
mass effect, evidence of mass lesion, ventriculomegaly, extra-axial
collection or acute intracranial hemorrhage. Cervicomedullary
junction within normal limits. Negative visualized cervical spine.
Major intracranial vascular flow voids are preserved. Minimal to
mild for age scattered nonspecific cerebral white matter T2 and
FLAIR hyperintensity. This is mostly subcortical, and more
pronounced in the right hemisphere (series 6, image 17). No cortical
encephalomalacia identified. Elsewhere normal gray and white matter
signal. No abnormal enhancement identified. No dural thickening.

Mild sphenoid and occasional ethmoid sinus mucosal thickening. Other
paranasal sinuses are clear. Negative orbits soft tissues. Negative
scalp soft tissues aside from occasional probable sebaceous cysts.
Visualized bone marrow signal is within normal limits.

Dedicated IAC imaging. Normal cerebellopontine angles. Normal
bilateral cisternal and intracanalicular 7th and 8th cranial nerve
segments. Preserved T2 signal in the bilateral cochlea and
vestibular structures. No abnormal enhancement identified. Trace
inferior mastoid fluid on the right. Negative nasopharynx.
Stylomastoid foramina appear normal. The parotid glands are within
normal limits.
IMPRESSION: 1. Negative IAC imaging.
2. Chronic partially empty sella appearance, often a normal anatomic
variation but can be associated with idiopathic intracranial
hypertension (pseudotumor cerebri).
3. Minimal to mild for age nonspecific cerebral white matter signal
changes, significance doubtful.

## 2017-05-27 DIAGNOSIS — E039 Hypothyroidism, unspecified: Secondary | ICD-10-CM | POA: Diagnosis not present

## 2017-05-28 DIAGNOSIS — B351 Tinea unguium: Secondary | ICD-10-CM | POA: Diagnosis not present

## 2017-06-30 DIAGNOSIS — B351 Tinea unguium: Secondary | ICD-10-CM | POA: Diagnosis not present

## 2017-06-30 DIAGNOSIS — E039 Hypothyroidism, unspecified: Secondary | ICD-10-CM | POA: Diagnosis not present

## 2017-07-04 DIAGNOSIS — M25562 Pain in left knee: Secondary | ICD-10-CM | POA: Diagnosis not present

## 2017-07-04 DIAGNOSIS — Z1231 Encounter for screening mammogram for malignant neoplasm of breast: Secondary | ICD-10-CM | POA: Diagnosis not present

## 2017-07-04 DIAGNOSIS — M25561 Pain in right knee: Secondary | ICD-10-CM | POA: Diagnosis not present

## 2017-07-04 DIAGNOSIS — M170 Bilateral primary osteoarthritis of knee: Secondary | ICD-10-CM | POA: Diagnosis not present

## 2017-07-21 DIAGNOSIS — E039 Hypothyroidism, unspecified: Secondary | ICD-10-CM | POA: Diagnosis not present

## 2017-10-15 ENCOUNTER — Other Ambulatory Visit: Payer: Self-pay

## 2017-10-15 ENCOUNTER — Encounter: Payer: Self-pay | Admitting: Emergency Medicine

## 2017-10-15 ENCOUNTER — Emergency Department
Admission: EM | Admit: 2017-10-15 | Discharge: 2017-10-16 | Disposition: A | Discharge status: Home / Self Care | Payer: 59 | Attending: Emergency Medicine | Admitting: Emergency Medicine

## 2017-10-15 DIAGNOSIS — I10 Essential (primary) hypertension: Secondary | ICD-10-CM | POA: Diagnosis not present

## 2017-10-15 DIAGNOSIS — E039 Hypothyroidism, unspecified: Secondary | ICD-10-CM | POA: Diagnosis not present

## 2017-10-15 DIAGNOSIS — F17210 Nicotine dependence, cigarettes, uncomplicated: Secondary | ICD-10-CM | POA: Diagnosis not present

## 2017-10-15 DIAGNOSIS — I868 Varicose veins of other specified sites: Secondary | ICD-10-CM | POA: Diagnosis not present

## 2017-10-15 DIAGNOSIS — I83891 Varicose veins of right lower extremities with other complications: Secondary | ICD-10-CM | POA: Insufficient documentation

## 2017-10-15 DIAGNOSIS — I83899 Varicose veins of unspecified lower extremities with other complications: Secondary | ICD-10-CM

## 2017-10-15 DIAGNOSIS — Z79899 Other long term (current) drug therapy: Secondary | ICD-10-CM | POA: Diagnosis not present

## 2017-10-15 NOTE — ED Triage Notes (Addendum)
Pt was taking shower and she wiped her right lower leg and varicose vein started bleeding. No bleeding noted at this time, surgicel and dressing applied.

## 2017-10-15 NOTE — Discharge Instructions (Signed)
1.  Do not bear weight on affected foot for the next 24 hours.  Use crutches to help you walk. 2.  Elevate affected area as much as possible over the next 1 to 2 days. 3.  Return to the ER for recurrent or worsening symptoms, persistent vomiting, difficulty breathing or other concerns.

## 2017-10-15 NOTE — ED Provider Notes (Signed)
[facility] Emergency Department Provider Note   ____________________________________________   First MD Initiated Contact with Patient 10/15/17 2342     (approximate)  I have reviewed the triage vital signs and the nursing notes.   HISTORY  Chief Complaint Bleeding/Bruising    HPI Felicia Durham is a 57 y.o. female who presents to the ED from home with a chief complaint of bleeding varicose vein.  Patient was taking a shower when she wiped her right lower leg and noted bleeding from a varicose vein.  Arrived to triage without bleeding; Surgicel dressing was applied.  The patient voices no complaints.  Denies chest pain, shortness of breath, abdominal pain, nausea, vomiting, lightheadedness or dizziness.  Denies pain to the affected area.  Denies taking anticoagulants.   Past Medical History:  Diagnosis Date  . Anxiety   . GERD (gastroesophageal reflux disease)   . Hypertension   . Hypothyroidism     There are no active problems to display for this patient.   Past Surgical History:  Procedure Laterality Date  . ABDOMINAL HYSTERECTOMY    . SHOULDER ARTHROSCOPY Right 12/27/2014   Procedure: Shoulder Arthroscopy, decompression, debridement, rotator cuff repair;  Surgeon: Christena Flake, MD;  Location: [facility] ORS;  Service: Orthopedics;  Laterality: Right;    Prior to Admission medications   Medication Sig Start Date End Date Taking? Authorizing Provider  citalopram (CELEXA) 40 MG tablet Take 40 mg by mouth daily.    [provider]  hydrochlorothiazide (HYDRODIURIL) 25 MG tablet Take 25 mg by mouth daily.    [provider]  levothyroxine (SYNTHROID, LEVOTHROID) 125 MCG tablet Take 125 mcg by mouth daily.    [provider]  oxyCODONE (ROXICODONE) 5 MG immediate release tablet Take 1-2 tablets (5-10 mg total) by mouth every 4 (four) hours as needed for severe pain. 12/27/14   Poggi, Excell Seltzer, MD    Allergies Patient  has no known allergies.  Family History  Problem Relation Age of Onset  . Breast cancer Other   . Breast cancer Maternal Aunt     Social History Social History   Tobacco Use  . Smoking status: Current Some Day Smoker    Packs/day: 0.25    Years: 17.00    Pack years: 4.25    Types: Cigarettes  . Smokeless tobacco: Never Used  Substance Use Topics  . Alcohol use: No    Alcohol/week: 0.0 standard drinks  . Drug use: No    Review of Systems  Constitutional: No fever/chills Eyes: No visual changes. ENT: No sore throat. Cardiovascular: Denies chest pain. Respiratory: Denies shortness of breath. Gastrointestinal: No abdominal pain.  No nausea, no vomiting.  No diarrhea.  No constipation. Genitourinary: Negative for dysuria. Musculoskeletal: Positive for bleeding varicose vein.  Negative for back pain. Skin: Negative for rash. Neurological: Negative for headaches, focal weakness or numbness.   ____________________________________________   PHYSICAL EXAM:  VITAL SIGNS: ED Triage Vitals [10/15/17 2250]  Enc Vitals Group     BP (!) 145/92     Pulse Rate 77     Resp 20     Temp 98.7 F (37.1 C)     Temp Source Oral     SpO2 100 %     Weight 225 lb (102.1 kg)     Height      Head Circumference      Peak Flow      Pain Score 0     Pain Loc  Pain Edu?      Excl. in [place]?     Constitutional: Alert and oriented. Well appearing and in no acute distress. Eyes: Conjunctivae are normal. PERRL. EOMI. Head: Atraumatic. Nose: No congestion/rhinnorhea. Mouth/Throat: Mucous membranes are moist.  Oropharynx non-erythematous. Neck: No stridor.   Cardiovascular: Normal rate, regular rhythm. Grossly normal heart sounds.  Good peripheral circulation. Respiratory: Normal respiratory effort.  No retractions. Lungs CTAB. Gastrointestinal: Soft and nontender. No distention. No abdominal bruits. No CVA tenderness. Musculoskeletal:  RLE: Dressing on wrapped to find nonbleeding  varicose vein on the medial aspect of lower shin.  2+ distal pulses.  Brisk, less than 5-second capillary refill.  Multiple varicose veins noted to bilateral lower extremities. Neurologic:  Normal speech and language. No gross focal neurologic deficits are appreciated. No gait instability. Skin:  Skin is warm, dry and intact. No rash noted. Psychiatric: Mood and affect are normal. Speech and behavior are normal.  ____________________________________________   LABS (all labs ordered are listed, but only abnormal results are displayed)  Labs Reviewed - No data to display ____________________________________________  EKG  None ____________________________________________  RADIOLOGY  ED MD interpretation: None  Official radiology report(s): No results found.  ____________________________________________   PROCEDURES  Procedure(s) performed: None  Procedures  Critical Care performed: No  ____________________________________________   INITIAL IMPRESSION / ASSESSMENT AND PLAN / ED COURSE  As part of my medical decision making, I reviewed the following data within the electronic MEDICAL RECORD NUMBER History obtained from family, Nursing notes reviewed and incorporated and Notes from prior ED visits   57 year old female who presents with bleeding varicose vein from her right lower leg.  Currently area is not bleeding.  Patient voices no complaints.  Will rewrap the affected area with Surgicel and Xeroform.  Patient provided crutches and instructed to be nonweightbearing on her right leg for the next 24 hours.  Will refer to vascular surgery to address her other varicose veins.  Strict return precautions given.  Patient and spouse verbalize understanding and agree with plan of care.      ____________________________________________   FINAL CLINICAL IMPRESSION(S) / ED DIAGNOSES  Final diagnoses:  Bleeding from varicose vein     ED Discharge Orders    None        Note:  This document was prepared using Dragon voice recognition software and may include unintentional dictation errors.    Irean HongSung, Jade J, MD 10/16/17 [phone]

## 2017-12-24 DIAGNOSIS — B301 Conjunctivitis due to adenovirus: Secondary | ICD-10-CM | POA: Diagnosis not present

## 2018-01-27 DIAGNOSIS — J069 Acute upper respiratory infection, unspecified: Secondary | ICD-10-CM | POA: Diagnosis not present

## 2018-01-27 DIAGNOSIS — M79604 Pain in right leg: Secondary | ICD-10-CM | POA: Diagnosis not present

## 2018-01-27 DIAGNOSIS — Z1389 Encounter for screening for other disorder: Secondary | ICD-10-CM | POA: Diagnosis not present

## 2018-01-27 DIAGNOSIS — F3341 Major depressive disorder, recurrent, in partial remission: Secondary | ICD-10-CM | POA: Diagnosis not present

## 2018-01-29 DIAGNOSIS — B349 Viral infection, unspecified: Secondary | ICD-10-CM | POA: Diagnosis not present

## 2018-03-25 DIAGNOSIS — H524 Presbyopia: Secondary | ICD-10-CM | POA: Diagnosis not present

## 2018-03-30 DIAGNOSIS — R7309 Other abnormal glucose: Secondary | ICD-10-CM | POA: Diagnosis not present

## 2018-03-30 DIAGNOSIS — R51 Headache: Secondary | ICD-10-CM | POA: Diagnosis not present

## 2018-03-30 DIAGNOSIS — E039 Hypothyroidism, unspecified: Secondary | ICD-10-CM | POA: Diagnosis not present

## 2018-04-30 DIAGNOSIS — E236 Other disorders of pituitary gland: Secondary | ICD-10-CM | POA: Diagnosis not present

## 2018-04-30 DIAGNOSIS — G43019 Migraine without aura, intractable, without status migrainosus: Secondary | ICD-10-CM | POA: Diagnosis not present

## 2018-04-30 DIAGNOSIS — H938X3 Other specified disorders of ear, bilateral: Secondary | ICD-10-CM | POA: Diagnosis not present

## 2018-04-30 DIAGNOSIS — R5382 Chronic fatigue, unspecified: Secondary | ICD-10-CM | POA: Diagnosis not present

## 2018-07-09 ENCOUNTER — Telehealth: Payer: Self-pay | Admitting: *Deleted

## 2018-07-09 DIAGNOSIS — Z20828 Contact with and (suspected) exposure to other viral communicable diseases: Secondary | ICD-10-CM | POA: Diagnosis not present

## 2018-07-09 DIAGNOSIS — Z20822 Contact with and (suspected) exposure to covid-19: Secondary | ICD-10-CM

## 2018-07-09 NOTE — Telephone Encounter (Signed)
Contacted by Glenna Fellows, RN from the [facility]; she states that Dr Delight Stare would like to have the pt tested for COVID; the pt was exposed to [place]; address and phone number verified; the pt can is spanish speaking and can be contacted at [phone]; will attempt to contact pt.

## 2018-07-09 NOTE — Telephone Encounter (Addendum)
Assisted by Laurance Flatten # [phone]; contacted pt to schedule testing; pt offered and accepted appointment at [facility] site 07/10/2018 at 0800; pt given address, location, and information that she and all occupants of her vehicle should wear masks; she verbalized understanding; orders placed per protocol.

## 2018-07-10 ENCOUNTER — Other Ambulatory Visit: Payer: 59

## 2018-07-10 DIAGNOSIS — Z20822 Contact with and (suspected) exposure to covid-19: Secondary | ICD-10-CM

## 2018-07-10 DIAGNOSIS — R6889 Other general symptoms and signs: Secondary | ICD-10-CM | POA: Diagnosis not present

## 2018-07-12 LAB — NOVEL CORONAVIRUS, NAA: SARS-CoV-2, NAA: NOT DETECTED

## 2018-09-01 DIAGNOSIS — E039 Hypothyroidism, unspecified: Secondary | ICD-10-CM | POA: Diagnosis not present

## 2018-12-25 ENCOUNTER — Other Ambulatory Visit: Payer: Self-pay

## 2018-12-25 ENCOUNTER — Emergency Department
Admission: EM | Admit: 2018-12-25 | Discharge: 2018-12-25 | Disposition: A | Discharge status: Home / Self Care | Payer: 59 | Attending: Student in an Organized Health Care Education/Training Program | Admitting: Student in an Organized Health Care Education/Training Program

## 2018-12-25 DIAGNOSIS — I83899 Varicose veins of unspecified lower extremities with other complications: Secondary | ICD-10-CM | POA: Insufficient documentation

## 2018-12-25 DIAGNOSIS — I83891 Varicose veins of right lower extremities with other complications: Secondary | ICD-10-CM | POA: Diagnosis not present

## 2018-12-25 DIAGNOSIS — F17210 Nicotine dependence, cigarettes, uncomplicated: Secondary | ICD-10-CM | POA: Insufficient documentation

## 2018-12-25 DIAGNOSIS — Z79899 Other long term (current) drug therapy: Secondary | ICD-10-CM | POA: Diagnosis not present

## 2018-12-25 DIAGNOSIS — I10 Essential (primary) hypertension: Secondary | ICD-10-CM | POA: Insufficient documentation

## 2018-12-25 DIAGNOSIS — E039 Hypothyroidism, unspecified: Secondary | ICD-10-CM | POA: Insufficient documentation

## 2018-12-25 NOTE — ED Provider Notes (Signed)
[facility] Emergency Department Provider Note  ____________________________________________   First MD Initiated Contact with Patient 12/25/18 1008     (approximate)  I have reviewed the triage vital signs and the nursing notes.   HISTORY  Chief Complaint Varicose Vein Bleeding Spanish interpreter  HPI Felicia Durham is a 58 y.o. female presents to the ED with complaint of bleeding at her right ankle.  Patient has a history of varicose veins and states that she hit her leg on something which caused the varicose vein to begin bleeding.  She states this is happened before.  Patient denies blood thinners or taking daily aspirin.  She states that she has a lot of varicose veins which makes her legs hurt.  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  Currently she rates her pain as a 9/10.      Past Medical History:  Diagnosis Date  . Anxiety   . GERD (gastroesophageal reflux disease)   . Hypertension   . Hypothyroidism     There are no active problems to display for this patient.   Past Surgical History:  Procedure Laterality Date  . ABDOMINAL HYSTERECTOMY    . SHOULDER ARTHROSCOPY Right 12/27/2014   Procedure: Shoulder Arthroscopy, decompression, debridement, rotator cuff repair;  Surgeon: Corky Mull, MD;  Location: [facility] ORS;  Service: Orthopedics;  Laterality: Right;    Prior to Admission medications   Medication Sig Start Date End Date Taking? Authorizing Provider  citalopram (CELEXA) 40 MG tablet Take 40 mg by mouth daily.    [provider]  hydrochlorothiazide (HYDRODIURIL) 25 MG tablet Take 25 mg by mouth daily.    [provider]  levothyroxine (SYNTHROID, LEVOTHROID) 125 MCG tablet Take 125 mcg by mouth daily.    [provider]    Allergies Patient has no known allergies.  Family History  Problem Relation Age of Onset  . Breast cancer Other   . Breast cancer Maternal Aunt      Social History Social History   Tobacco Use  . Smoking status: Current Some Day Smoker    Packs/day: 0.25    Years: 17.00    Pack years: 4.25    Types: Cigarettes  . Smokeless tobacco: Never Used  Substance Use Topics  . Alcohol use: No    Alcohol/week: 0.0 standard drinks  . Drug use: No    Review of Systems Constitutional: No fever/chills Cardiovascular: Denies chest pain. Respiratory: Denies shortness of breath. Gastrointestinal: No abdominal pain.  No nausea, no vomiting.  Musculoskeletal: Chronic right leg pain. Skin: Positive varicose veins lower extremities. Neurological: Negative for headaches, focal weakness or numbness. ____________________________________________   PHYSICAL EXAM:  VITAL SIGNS: ED Triage Vitals  Enc Vitals Group     BP 12/25/18 0958 (!) 145/91     Pulse Rate 12/25/18 0958 73     Resp 12/25/18 0958 18     Temp 12/25/18 0958 100 F (37.8 C)     Temp Source 12/25/18 0958 Oral     SpO2 12/25/18 0958 96 %     Weight 12/25/18 1000 220 lb (99.8 kg)     Height 12/25/18 1000 5\' 6"  (1.676 m)     Head Circumference --      Peak Flow --      Pain Score 12/25/18 0959 9     Pain Loc --      Pain Edu? --      Excl. in [place]? --  Constitutional: Alert and oriented. Well appearing and in no acute distress. Eyes: Conjunctivae are normal.  Head: Atraumatic. Neck: No stridor.   Cardiovascular: Normal rate, regular rhythm. Grossly normal heart sounds.  Good peripheral circulation. Respiratory: Normal respiratory effort.  No retractions. Lungs CTAB. Gastrointestinal: Soft and nontender. No distention. No abdominal bruits. No CVA tenderness. Musculoskeletal: Patient is able move upper and lower extremities that any difficulty. Neurologic:  Normal speech and language. No gross focal neurologic deficits are appreciated. No gait instability. Skin:  Skin is warm, dry.  On exam there is a superficial varicose vein with evidence of bleeding but controlled at  this time.  Area is located on the medial aspect above her right ankle.  No other injuries are noted in this area.  Patient does have some tortuous varicose veins noted in the lower extremity and thigh area. Psychiatric: Mood and affect are normal. Speech and behavior are normal.  ____________________________________________   LABS (all labs ordered are listed, but only abnormal results are displayed)  Labs Reviewed - No data to display   PROCEDURES  Procedure(s) performed (including Critical Care):  Procedures  ____________________________________________   INITIAL IMPRESSION / ASSESSMENT AND PLAN / ED COURSE  As part of my medical decision making, I reviewed the following data within the electronic MEDICAL RECORD NUMBER Notes from prior ED visits and [place] Controlled Substance Database ----------------------------------------- 11:30 AM on 12/25/2018 ----------------------------------------- Right leg was examined twice with no active bleeding present.  A pressure dressing was applied in triage.  Patient was encouraged to follow-up with the vascular and vein clinics listed on her discharge papers.  She was also instructed to elevate her leg and to leave the compression dressing on at this time.  She is return to the emergency department if any severe worsening of her symptoms.  She reports that she does not go back to work until the first of the week.   ____________________________________________   FINAL CLINICAL IMPRESSION(S) / ED DIAGNOSES  Final diagnoses:  Bleeding from varicose vein     ED Discharge Orders    None       Note:  This document was prepared using Dragon voice recognition software and may include unintentional dictation errors.    Tommi Rumps, PA-C 12/25/18 1330    Willy Eddy, MD 12/25/18 [phone]

## 2018-12-25 NOTE — ED Triage Notes (Signed)
Pt c/o hitting her right ankle and opened the varicose vein pt arrival with a bloody towel wrapped around it.Marland Kitchen

## 2018-12-25 NOTE — Discharge Instructions (Signed)
Follow-up with your primary care provider if any continued problems. Also consider seeing the vascular surgeon who takes care of varicose veins. Should your leg began bleeding again apply pressure and wrap with Ace wrap. Sit down and elevate your leg for approximately 20 minutes. Also seeing the vein specialist would help with your situation.

## 2018-12-25 NOTE — ED Notes (Addendum)
See triage note. Pt placed in bed, pressure wrap around right ankle placed by first nurse. Pt in no distress, bleeding appears controlled at this time, warm blanket given.

## 2019-01-13 DIAGNOSIS — I8310 Varicose veins of unspecified lower extremity with inflammation: Secondary | ICD-10-CM | POA: Diagnosis not present

## 2019-01-13 DIAGNOSIS — Z1211 Encounter for screening for malignant neoplasm of colon: Secondary | ICD-10-CM | POA: Diagnosis not present

## 2019-01-13 DIAGNOSIS — E039 Hypothyroidism, unspecified: Secondary | ICD-10-CM | POA: Diagnosis not present

## 2019-01-15 ENCOUNTER — Other Ambulatory Visit: Payer: Self-pay | Admitting: Family Medicine

## 2019-01-15 DIAGNOSIS — E039 Hypothyroidism, unspecified: Secondary | ICD-10-CM | POA: Diagnosis not present

## 2019-01-15 DIAGNOSIS — Z1211 Encounter for screening for malignant neoplasm of colon: Secondary | ICD-10-CM | POA: Diagnosis not present

## 2019-01-15 DIAGNOSIS — Z1231 Encounter for screening mammogram for malignant neoplasm of breast: Secondary | ICD-10-CM

## 2019-01-27 DIAGNOSIS — E039 Hypothyroidism, unspecified: Secondary | ICD-10-CM | POA: Diagnosis not present

## 2019-01-27 DIAGNOSIS — Z23 Encounter for immunization: Secondary | ICD-10-CM | POA: Diagnosis not present

## 2019-01-27 DIAGNOSIS — Z1389 Encounter for screening for other disorder: Secondary | ICD-10-CM | POA: Diagnosis not present

## 2019-04-13 ENCOUNTER — Other Ambulatory Visit (INDEPENDENT_AMBULATORY_CARE_PROVIDER_SITE_OTHER): Payer: Self-pay | Admitting: Nurse Practitioner

## 2019-04-13 DIAGNOSIS — I8390 Asymptomatic varicose veins of unspecified lower extremity: Secondary | ICD-10-CM

## 2019-04-14 ENCOUNTER — Ambulatory Visit (INDEPENDENT_AMBULATORY_CARE_PROVIDER_SITE_OTHER): Payer: BC Managed Care – PPO

## 2019-04-14 ENCOUNTER — Encounter (INDEPENDENT_AMBULATORY_CARE_PROVIDER_SITE_OTHER): Payer: Self-pay | Admitting: Nurse Practitioner

## 2019-04-14 ENCOUNTER — Other Ambulatory Visit: Payer: Self-pay

## 2019-04-14 ENCOUNTER — Ambulatory Visit (INDEPENDENT_AMBULATORY_CARE_PROVIDER_SITE_OTHER): Payer: BC Managed Care – PPO | Admitting: Nurse Practitioner

## 2019-04-14 VITALS — BP 140/79 | HR 76 | Resp 17 | Ht 63.0 in | Wt 234.0 lb

## 2019-04-14 DIAGNOSIS — I10 Essential (primary) hypertension: Secondary | ICD-10-CM

## 2019-04-14 DIAGNOSIS — I83893 Varicose veins of bilateral lower extremities with other complications: Secondary | ICD-10-CM | POA: Diagnosis not present

## 2019-04-14 DIAGNOSIS — I8390 Asymptomatic varicose veins of unspecified lower extremity: Secondary | ICD-10-CM | POA: Diagnosis not present

## 2019-04-16 ENCOUNTER — Encounter (INDEPENDENT_AMBULATORY_CARE_PROVIDER_SITE_OTHER): Payer: Self-pay | Admitting: Nurse Practitioner

## 2019-04-16 NOTE — Progress Notes (Signed)
SUBJECTIVE:  Patient ID: Felicia Durham, female    DOB: 1960/06/01, 59 y.o.   MRN: [id number] Chief Complaint  Patient presents with  . New Patient (Initial Visit)    varicose veins and ultrasound    HPI  Felicia Durham is a 59 y.o. female The patient is seen for evaluation of symptomatic varicose veins. The patient relates burning and stinging which worsened steadily throughout the course of the day, particularly with standing. The patient also notes an aching and throbbing pain over the varicosities, particularly with prolonged dependent positions. The symptoms are significantly improved with elevation.  The patient also notes that during hot weather the symptoms are greatly intensified. The patient states the pain from the varicose veins interferes with work, daily exercise, shopping and household maintenance. At this point, the symptoms are persistent and severe enough that they're having a negative impact on lifestyle and are interfering with daily activities.  The most concerning symptom however for the patient is the fact that she has had multiple issues of spontaneous bleeding from her vein.  She states that this is happened approximately 4or 5 times now.  She states that she is generally able to stop the bleeding however the most recent time she was concerned that she may need emergency attention before she was able to get the bleeding to stop.  There is no history of DVT, PE or superficial thrombophlebitis.  The patient denies a significant family history of varicose veins.   The patient has worn graduated compression in the past.  The patient has been using over-the-counter analgesics for pain relief.  She denies any previous treatment of her varicose veins.  This includes sclerotherapy or ablation.  The patient also elevates as much as possible.  Patient underwent noninvasive studies today which reveals reflux in her deep system at the common femoral vein as well  as at the femoral vein in the mid thigh.  The patient has reflux in the great saphenous vein starting from the proximal thigh extending to the knee.  The vein measures 0.49 cm to 0.59 cm.  The left lower extremity has no evidence of reflux.  There is no evidence of DVT or superficial venous thrombosis bilaterally.  Past Medical History:  Diagnosis Date  . Anxiety   . GERD (gastroesophageal reflux disease)   . Hypertension   . Hypothyroidism     Past Surgical History:  Procedure Laterality Date  . ABDOMINAL HYSTERECTOMY    . SHOULDER ARTHROSCOPY Right 12/27/2014   Procedure: Shoulder Arthroscopy, decompression, debridement, rotator cuff repair;  Surgeon: Christena Flake, MD;  Location: [facility] ORS;  Service: Orthopedics;  Laterality: Right;    Social History   Socioeconomic History  . Marital status: Single    Spouse name: Not on file  . Number of children: Not on file  . Years of education: Not on file  . Highest education level: Not on file  Occupational History  . Not on file  Tobacco Use  . Smoking status: Former Smoker    Packs/day: 0.25    Years: 17.00    Pack years: 4.25    Types: Cigarettes  . Smokeless tobacco: Never Used  Substance and Sexual Activity  . Alcohol use: No    Alcohol/week: 0.0 standard drinks  . Drug use: No  . Sexual activity: Not on file  Other Topics Concern  . Not on file  Social History Narrative  . Not on file   Social Determinants of Health  Financial Resource Strain:   . Difficulty of Paying Living Expenses:   Food Insecurity:   . Worried About Programme researcher, broadcasting/film/video in the Last Year:   . Barista in the Last Year:   Transportation Needs:   . Freight forwarder (Medical):   Marland Kitchen Lack of Transportation (Non-Medical):   Physical Activity:   . Days of Exercise per Week:   . Minutes of Exercise per Session:   Stress:   . Feeling of Stress :   Social Connections:   . Frequency of Communication with Friends and Family:   .  Frequency of Social Gatherings with Friends and Family:   . Attends Religious Services:   . Active Member of Clubs or Organizations:   . Attends Banker Meetings:   Marland Kitchen Marital Status:   Intimate Partner Violence:   . Fear of Current or Ex-Partner:   . Emotionally Abused:   Marland Kitchen Physically Abused:   . Sexually Abused:     Family History  Problem Relation Age of Onset  . Breast cancer Other   . Breast cancer Maternal Aunt     No Known Allergies   Review of Systems   Review of Systems: Negative Unless Checked Constitutional: [] Weight loss  [] Fever  [] Chills Cardiac: [] Chest pain   []  Atrial Fibrillation  [] Palpitations   [] Shortness of breath when laying flat   [] Shortness of breath with exertion. [] Shortness of breath at rest Vascular:  [] Pain in legs with walking   [] Pain in legs with standing [] Pain in legs when laying flat   [] Claudication    [] Pain in feet when laying flat    [] History of DVT   [] Phlebitis   [x] Swelling in legs   [x] Varicose veins   [] Non-healing ulcers Pulmonary:   [] Uses home oxygen   [] Productive cough   [] Hemoptysis   [] Wheeze  [] COPD   [] Asthma Neurologic:  [] Dizziness   [] Seizures  [] Blackouts [] History of stroke   [] History of TIA  [] Aphasia   [] Temporary Blindness   [] Weakness or numbness in arm   [] Weakness or numbness in leg Musculoskeletal:   [] Joint swelling   [] Joint pain   [] Low back pain  []  History of Knee Replacement [] Arthritis [] back Surgeries  []  Spinal Stenosis    Hematologic:  [] Easy bruising  [] Easy bleeding   [] Hypercoagulable state   [] Anemic Gastrointestinal:  [] Diarrhea   [] Vomiting  [] Gastroesophageal reflux/heartburn   [] Difficulty swallowing. [] Abdominal pain Genitourinary:  [] Chronic kidney disease   [] Difficult urination  [] Anuric   [] Blood in urine [] Frequent urination  [] Burning with urination   [] Hematuria Skin:  [] Rashes   [] Ulcers [] Wounds Psychological:  [x] History of anxiety   [x]  History of major depression  []   Memory Difficulties      OBJECTIVE:   Physical Exam  BP 140/79 (BP Location: Left Arm)   Pulse 76   Resp 17   Ht 5\' 3"  (1.6 m)   Wt 234 lb (106.1 kg)   LMP  (LMP Unknown)   BMI 41.45 kg/m   Gen: WD/WN, NAD Head: [place]/AT, No temporalis wasting.  Ear/Nose/Throat: Hearing grossly intact, nares w/o erythema or drainage Eyes: PER, EOMI, sclera nonicteric.  Neck: Supple, no masses.  No JVD.  Pulmonary:  Good air movement, no use of accessory muscles.  Cardiac: RRR Vascular:  Large varicosities on the lateral portion of right extremity with mild stasis dermatitis.  1+ edema Vessel Right Left  Radial Palpable Palpable  Dorsalis Pedis Palpable Palpable  Posterior Tibial Palpable  Palpable   Gastrointestinal: soft, non-distended. No guarding/no peritoneal signs.  Musculoskeletal: M/S 5/5 throughout.  No deformity or atrophy.  Neurologic: Pain and light touch intact in extremities.  Symmetrical.  Speech is fluent. Motor exam as listed above. Psychiatric: Judgment intact, Mood & affect appropriate for pt's clinical situation. Dermatologic: No Ulcers Noted.  No changes consistent with cellulitis. Lymph : No Cervical lymphadenopathy, no lichenification or skin changes of chronic lymphedema.       ASSESSMENT AND PLAN:  1. Varicose veins of bilateral lower extremities with other complications Recommend  I have reviewed my previous  discussion with the patient regarding  varicose veins and why they cause symptoms. Patient will continue  wearing graduated compression stockings class 1 on a daily basis, beginning first thing in the morning and removing them in the evening.    In addition, behavioral modification including elevation during the day was again discussed and this will continue.  The patient has utilized over the counter pain medications and has been exercising.  However, at this time conservative therapy has not alleviated the patient's symptoms of leg pain and  swelling  Recommend: laser ablation of the right great saphenous veins to eliminate the symptoms of pain, hemorrhage and swelling of the lower extremities caused by the severe superficial venous reflux disease.   2. Essential hypertension Continue antihypertensive medications as already ordered, these medications have been reviewed and there are no changes at this time.    Current Outpatient Medications on File Prior to Visit  Medication Sig Dispense Refill  . citalopram (CELEXA) 40 MG tablet Take 40 mg by mouth daily.    . hydrochlorothiazide (HYDRODIURIL) 25 MG tablet Take 25 mg by mouth daily.    Marland Kitchen levothyroxine (SYNTHROID, LEVOTHROID) 125 MCG tablet Take 125 mcg by mouth daily.    Marland Kitchen venlafaxine XR (EFFEXOR-XR) 75 MG 24 hr capsule Take by mouth.     No current facility-administered medications on file prior to visit.    There are no Patient Instructions on file for this visit. No follow-ups on file.   Kris Hartmann, NP  This note was completed with Sales executive.  Any errors are purely unintentional.

## 2019-06-09 DIAGNOSIS — I83813 Varicose veins of bilateral lower extremities with pain: Secondary | ICD-10-CM

## 2019-06-09 HISTORY — DX: Varicose veins of bilateral lower extremities with pain: I83.813

## 2019-06-09 NOTE — Progress Notes (Signed)
    MRN : [id number]  Felicia Durham is a 59 y.o. (1960/10/11) female who presents with chief complaint of No chief complaint on file. .    The patient's right lower extremity was sterilely prepped and draped.  The ultrasound machine was used to visualize the right saphenous vein throughout its course.  A segment below the knee was selected for access.  The saphenous vein was accessed without difficulty using ultrasound guidance with a micropuncture needle.   An 0.018  wire was placed beyond the saphenofemoral junction through the sheath and the microneedle was removed.  The 65 cm sheath was then placed over the wire and the wire and dilator were removed.  The laser fiber was placed through the sheath and its tip was placed approximately 2 cm below the saphenofemoral junction.  Tumescent anesthesia was then created with a dilute lidocaine solution.  Laser energy was then delivered with constant withdrawal of the sheath and laser fiber.  Approximately 2130 Joules of energy were delivered over a length of 39 cm.  Sterile dressings were placed.  The patient tolerated the procedure well without complications.

## 2019-06-10 ENCOUNTER — Other Ambulatory Visit: Payer: Self-pay

## 2019-06-10 ENCOUNTER — Other Ambulatory Visit (INDEPENDENT_AMBULATORY_CARE_PROVIDER_SITE_OTHER): Payer: Self-pay | Admitting: Vascular Surgery

## 2019-06-10 ENCOUNTER — Ambulatory Visit (INDEPENDENT_AMBULATORY_CARE_PROVIDER_SITE_OTHER): Payer: BC Managed Care – PPO | Admitting: Vascular Surgery

## 2019-06-10 ENCOUNTER — Encounter (INDEPENDENT_AMBULATORY_CARE_PROVIDER_SITE_OTHER): Payer: Self-pay | Admitting: Vascular Surgery

## 2019-06-10 DIAGNOSIS — I83811 Varicose veins of right lower extremities with pain: Secondary | ICD-10-CM | POA: Diagnosis not present

## 2019-06-10 DIAGNOSIS — I83813 Varicose veins of bilateral lower extremities with pain: Secondary | ICD-10-CM

## 2019-06-14 ENCOUNTER — Other Ambulatory Visit: Payer: Self-pay

## 2019-06-14 ENCOUNTER — Ambulatory Visit (INDEPENDENT_AMBULATORY_CARE_PROVIDER_SITE_OTHER): Payer: BC Managed Care – PPO

## 2019-06-14 DIAGNOSIS — I83813 Varicose veins of bilateral lower extremities with pain: Secondary | ICD-10-CM | POA: Diagnosis not present

## 2020-03-06 ENCOUNTER — Other Ambulatory Visit: Payer: Self-pay | Admitting: Pediatrics

## 2020-03-06 DIAGNOSIS — Z1231 Encounter for screening mammogram for malignant neoplasm of breast: Secondary | ICD-10-CM

## 2021-06-20 ENCOUNTER — Ambulatory Visit
Admission: RE | Admit: 2021-06-20 | Discharge: 2021-06-20 | Disposition: A | Discharge status: Home / Self Care | Payer: 59 | Source: Ambulatory Visit | Attending: Family Medicine | Admitting: Family Medicine

## 2021-06-20 ENCOUNTER — Other Ambulatory Visit: Payer: Self-pay | Admitting: Family Medicine

## 2021-06-20 ENCOUNTER — Ambulatory Visit
Admission: RE | Admit: 2021-06-20 | Discharge: 2021-06-20 | Disposition: A | Discharge status: Home / Self Care | Payer: 59 | Source: Ambulatory Visit | Attending: Internal Medicine | Admitting: Internal Medicine

## 2021-06-20 DIAGNOSIS — R7612 Nonspecific reaction to cell mediated immunity measurement of gamma interferon antigen response without active tuberculosis: Secondary | ICD-10-CM

## 2022-07-12 DIAGNOSIS — M170 Bilateral primary osteoarthritis of knee: Secondary | ICD-10-CM | POA: Diagnosis not present

## 2022-07-12 DIAGNOSIS — G8929 Other chronic pain: Secondary | ICD-10-CM | POA: Diagnosis not present

## 2022-07-15 ENCOUNTER — Emergency Department
Admission: EM | Admit: 2022-07-15 | Discharge: 2022-07-15 | Disposition: A | Discharge status: Home / Self Care | Payer: Worker's Compensation | Attending: Emergency Medicine | Admitting: Emergency Medicine

## 2022-07-15 ENCOUNTER — Encounter: Payer: Self-pay | Admitting: Emergency Medicine

## 2022-07-15 ENCOUNTER — Other Ambulatory Visit: Payer: Self-pay

## 2022-07-15 DIAGNOSIS — I10 Essential (primary) hypertension: Secondary | ICD-10-CM | POA: Diagnosis not present

## 2022-07-15 DIAGNOSIS — R21 Rash and other nonspecific skin eruption: Secondary | ICD-10-CM | POA: Diagnosis present

## 2022-07-15 DIAGNOSIS — E039 Hypothyroidism, unspecified: Secondary | ICD-10-CM | POA: Diagnosis not present

## 2022-07-15 DIAGNOSIS — L509 Urticaria, unspecified: Secondary | ICD-10-CM

## 2022-07-15 DIAGNOSIS — L239 Allergic contact dermatitis, unspecified cause: Secondary | ICD-10-CM | POA: Insufficient documentation

## 2022-07-15 MED ORDER — FAMOTIDINE 20 MG PO TABS: 20.0000 mg | ORAL_TABLET | Freq: Two times a day (BID) | ORAL | 0 refills | Status: DC

## 2022-07-15 MED ORDER — PREDNISONE 20 MG PO TABS: 40.0000 mg | ORAL_TABLET | Freq: Every day | ORAL | 0 refills | Status: AC

## 2022-07-15 MED ORDER — FAMOTIDINE 20 MG PO TABS
40.0000 mg | ORAL_TABLET | Freq: Once | ORAL | Status: AC
  Administered 2022-07-15: 40 mg via ORAL
  Filled 2022-07-15: qty 2

## 2022-07-15 MED ORDER — DEXAMETHASONE SODIUM PHOSPHATE 10 MG/ML IJ SOLN
10.0000 mg | Freq: Once | INTRAMUSCULAR | Status: AC
  Administered 2022-07-15: 10 mg via INTRAMUSCULAR
  Filled 2022-07-15: qty 1

## 2022-07-15 MED ORDER — DIPHENHYDRAMINE HCL 25 MG PO CAPS
25.0000 mg | ORAL_CAPSULE | Freq: Once | ORAL | Status: AC
  Administered 2022-07-15: 25 mg via ORAL
  Filled 2022-07-15: qty 1

## 2022-07-15 NOTE — Discharge Instructions (Addendum)
Your exam is normal at this time.  UE exact cause of your itching is not known at this time.  Take the prescription steroid medicine as well as the antihistamine as directed.  Avoid being overheated and avoid long hot showers.  Follow-up with your primary provider or [facility] employee health clinic for ongoing evaluation.  Su examen es normal en este momento.  Por el momento se desconoce la causa exacta de la picazn.  Tome el medicamento esteroide recetado, as como el antihistamnico segn las indicaciones.  Evite el sobrecalentamiento y evite las duchas largas y calientes.  Haga un seguimiento con su proveedor primario o con la clnica de salud para empleados de [facility] para una evaluacin continua.

## 2022-07-15 NOTE — ED Triage Notes (Signed)
Pt sts that she was at work when she started to break out in a rash that is very itchy.

## 2022-07-15 NOTE — ED Provider Notes (Signed)
[facility] Emergency Department Provider Note     Event Date/Time   First MD Initiated Contact with Patient 07/15/22 1730     (approximate)   History   Rash   HPI  Felicia Durham is a 63 y.o. female with a history of anxiety, HTN, GERD, and hypothyroidism who presents to the ED for evaluation of a pruritic rash.  Patient reports he was at work here in [employer], she began to break out in a pruritic rash.  At this time patient denies any known contacts or exposures patient denies any difficulty breathing, swallowing, controlling oral secretions.     Physical Exam   Triage Vital Signs: ED Triage Vitals [07/15/22 1658]  Enc Vitals Group     BP (!) 158/99     Pulse Rate 76     Resp 18     Temp 98 F (36.7 C)     Temp Source Oral     SpO2 96 %     Weight      Height      Head Circumference      Peak Flow      Pain Score      Pain Loc      Pain Edu?      Excl. in GC?     Most recent vital signs: Vitals:   07/15/22 1658  BP: (!) 158/99  Pulse: 76  Resp: 18  Temp: 98 F (36.7 C)  SpO2: 96%    General Awake, no distress. N"AD HEENT NCAT. PERRL. EOMI. No rhinorrhea. Mucous membranes are moist.  Uvula is midline and tonsils are flat.  No oropharyngeal lesions appreciated. CV:  Good peripheral perfusion.  RESP:  Normal effort.  ABD:  No distention.  SKIN:  Patient with erythematous excoriation to the dorsal forearms bilaterally.  No blisters appreciated.  No significant hives or welts noted.   ED Results / Procedures / Treatments   Labs (all labs ordered are listed, but only abnormal results are displayed) Labs Reviewed - No data to display   EKG   RADIOLOGY   No results found.   PROCEDURES:  Critical Care performed: No  Procedures   MEDICATIONS ORDERED IN ED: Medications  dexamethasone (DECADRON) injection 10 mg (10 mg Intramuscular Given 07/15/22 1748)  famotidine (PEPCID) tablet 40 mg (40 mg Oral  Given 07/15/22 1747)  diphenhydrAMINE (BENADRYL) capsule 25 mg (25 mg Oral Given 07/15/22 1747)     IMPRESSION / MDM / ASSESSMENT AND PLAN / ED COURSE  I reviewed the triage vital signs and the nursing notes.                              Differential diagnosis includes, but is not limited to, contact dermatitis, hives, urticaria  Patient's presentation is most consistent with acute, uncomplicated illness.  Patient's diagnosis is consistent with idiopathic urticaria.  Exact cause of the patient's pruritic manage rash is unknown at this time.  Patient does not Dors improvement of her symptoms after ED medication administration of antihistamines including famotidine, Benadryl, and Decadron.  Patient will be discharged home with prescriptions for famotidine and prednisone. Patient is to follow up with [facility] employee health clinic as needed or otherwise directed. Patient is given ED precautions to return to the ED for any worsening or new symptoms.   FINAL CLINICAL IMPRESSION(S) / ED DIAGNOSES   Final diagnoses:  Allergic contact dermatitis, unspecified trigger  Urticaria     Rx / DC Orders   ED Discharge Orders          Ordered    famotidine (PEPCID) 20 MG tablet  2 times daily        07/15/22 1910    predniSONE (DELTASONE) 20 MG tablet  Daily with breakfast        07/15/22 1910             Note:  This document was prepared using Dragon voice recognition software and may include unintentional dictation errors.    Lissa Hoard, PA-C 07/16/22 2305    Georga Hacking, MD 07/20/22 [phone]

## 2022-08-08 DIAGNOSIS — F419 Anxiety disorder, unspecified: Secondary | ICD-10-CM | POA: Diagnosis not present

## 2022-08-08 DIAGNOSIS — Z0000 Encounter for general adult medical examination without abnormal findings: Secondary | ICD-10-CM | POA: Diagnosis not present

## 2022-08-08 DIAGNOSIS — I10 Essential (primary) hypertension: Secondary | ICD-10-CM | POA: Diagnosis not present

## 2022-08-08 DIAGNOSIS — E039 Hypothyroidism, unspecified: Secondary | ICD-10-CM | POA: Diagnosis not present

## 2022-08-08 DIAGNOSIS — R0781 Pleurodynia: Secondary | ICD-10-CM | POA: Diagnosis not present

## 2022-08-08 DIAGNOSIS — R7303 Prediabetes: Secondary | ICD-10-CM | POA: Diagnosis not present

## 2022-08-08 DIAGNOSIS — Z1231 Encounter for screening mammogram for malignant neoplasm of breast: Secondary | ICD-10-CM | POA: Diagnosis not present

## 2022-08-08 DIAGNOSIS — Z1211 Encounter for screening for malignant neoplasm of colon: Secondary | ICD-10-CM | POA: Diagnosis not present

## 2022-08-08 DIAGNOSIS — F324 Major depressive disorder, single episode, in partial remission: Secondary | ICD-10-CM | POA: Diagnosis not present

## 2022-08-09 ENCOUNTER — Other Ambulatory Visit: Payer: Self-pay | Admitting: Family Medicine

## 2022-08-09 ENCOUNTER — Other Ambulatory Visit: Payer: Self-pay

## 2022-08-09 DIAGNOSIS — Z1231 Encounter for screening mammogram for malignant neoplasm of breast: Secondary | ICD-10-CM

## 2022-08-09 MED ORDER — WEGOVY 0.25 MG/0.5ML ~~LOC~~ SOAJ
0.5000 mL | SUBCUTANEOUS | 0 refills | Status: DC
  Filled 2022-08-09: qty 2, 28d supply, fill #0

## 2022-08-16 ENCOUNTER — Other Ambulatory Visit: Payer: Self-pay

## 2022-08-21 ENCOUNTER — Ambulatory Visit
Admission: RE | Admit: 2022-08-21 | Discharge: 2022-08-21 | Disposition: A | Discharge status: Home / Self Care | Payer: 59 | Source: Ambulatory Visit | Attending: Family Medicine | Admitting: Family Medicine

## 2022-08-21 DIAGNOSIS — Z1231 Encounter for screening mammogram for malignant neoplasm of breast: Secondary | ICD-10-CM | POA: Diagnosis not present

## 2022-08-26 ENCOUNTER — Inpatient Hospital Stay
Admission: RE | Admit: 2022-08-26 | Discharge: 2022-08-26 | Disposition: A | Discharge status: Home / Self Care | Payer: Self-pay | Source: Ambulatory Visit | Attending: Family Medicine | Admitting: Family Medicine

## 2022-08-26 ENCOUNTER — Other Ambulatory Visit: Payer: Self-pay | Admitting: *Deleted

## 2022-08-26 DIAGNOSIS — Z1231 Encounter for screening mammogram for malignant neoplasm of breast: Secondary | ICD-10-CM

## 2022-12-22 ENCOUNTER — Other Ambulatory Visit: Payer: Self-pay

## 2022-12-22 ENCOUNTER — Emergency Department: Payer: Self-pay

## 2022-12-22 ENCOUNTER — Emergency Department
Admission: EM | Admit: 2022-12-22 | Discharge: 2022-12-22 | Disposition: A | Discharge status: Home / Self Care | Payer: Self-pay | Attending: Emergency Medicine | Admitting: Emergency Medicine

## 2022-12-22 ENCOUNTER — Encounter: Payer: Self-pay | Admitting: Emergency Medicine

## 2022-12-22 DIAGNOSIS — E039 Hypothyroidism, unspecified: Secondary | ICD-10-CM | POA: Insufficient documentation

## 2022-12-22 DIAGNOSIS — N3000 Acute cystitis without hematuria: Secondary | ICD-10-CM | POA: Insufficient documentation

## 2022-12-22 DIAGNOSIS — B9629 Other Escherichia coli [E. coli] as the cause of diseases classified elsewhere: Secondary | ICD-10-CM | POA: Insufficient documentation

## 2022-12-22 DIAGNOSIS — I10 Essential (primary) hypertension: Secondary | ICD-10-CM | POA: Insufficient documentation

## 2022-12-22 DIAGNOSIS — Z1152 Encounter for screening for COVID-19: Secondary | ICD-10-CM | POA: Insufficient documentation

## 2022-12-22 LAB — URINALYSIS, W/ REFLEX TO CULTURE (INFECTION SUSPECTED)
Bilirubin Urine: NEGATIVE
Glucose, UA: NEGATIVE mg/dL
Ketones, ur: NEGATIVE mg/dL
Nitrite: NEGATIVE
Protein, ur: NEGATIVE mg/dL
Specific Gravity, Urine: 1.006 (ref 1.005–1.030)
WBC, UA: >50 WBC/hpf (ref 0–5)
pH: 6 (ref 5.0–8.0)

## 2022-12-22 LAB — CBC WITH DIFFERENTIAL/PLATELET
Abs Immature Granulocytes: 0.03 10*3/uL (ref 0.00–0.07)
Basophils Absolute: 0 10*3/uL (ref 0.0–0.1)
Basophils Relative: 0 %
Eosinophils Absolute: 0 10*3/uL (ref 0.0–0.5)
Eosinophils Relative: 0 %
HCT: 36.4 % (ref 36.0–46.0)
Hemoglobin: 12.1 g/dL (ref 12.0–15.0)
Immature Granulocytes: 0 %
Lymphocytes Relative: 11 %
Lymphs Abs: 0.9 10*3/uL (ref 0.7–4.0)
MCH: 29.9 pg (ref 26.0–34.0)
MCHC: 33.2 g/dL (ref 30.0–36.0)
MCV: 89.9 fL (ref 80.0–100.0)
Monocytes Absolute: 0.8 10*3/uL (ref 0.1–1.0)
Monocytes Relative: 10 %
Neutro Abs: 6.5 10*3/uL (ref 1.7–7.7)
Neutrophils Relative %: 79 %
Platelets: 224 10*3/uL (ref 150–400)
RBC: 4.05 MIL/uL (ref 3.87–5.11)
RDW: 12.5 % (ref 11.5–15.5)
WBC: 8.3 10*3/uL (ref 4.0–10.5)
nRBC: 0 % (ref 0.0–0.2)

## 2022-12-22 LAB — BASIC METABOLIC PANEL
Anion gap: 9 (ref 5–15)
BUN: 15 mg/dL (ref 8–23)
CO2: 25 mmol/L (ref 22–32)
Calcium: 8.4 mg/dL — ABNORMAL LOW (ref 8.9–10.3)
Chloride: 97 mmol/L — ABNORMAL LOW (ref 98–111)
Creatinine, Ser: 0.75 mg/dL (ref 0.44–1.00)
GFR, Estimated: >60 mL/min (ref >60)
Glucose, Bld: 122 mg/dL — ABNORMAL HIGH (ref 70–99)
Potassium: 3.8 mmol/L (ref 3.5–5.1)
Sodium: 131 mmol/L — ABNORMAL LOW (ref 135–145)

## 2022-12-22 LAB — RESP PANEL BY RT-PCR (RSV, FLU A&B, COVID)  RVPGX2
Influenza A by PCR: NEGATIVE
Influenza B by PCR: NEGATIVE
Resp Syncytial Virus by PCR: NEGATIVE
SARS Coronavirus 2 by RT PCR: NEGATIVE

## 2022-12-22 LAB — GROUP A STREP BY PCR: Group A Strep by PCR: NOT DETECTED

## 2022-12-22 MED ORDER — CEFDINIR 300 MG PO CAPS: 300.0000 mg | ORAL_CAPSULE | Freq: Two times a day (BID) | ORAL | 0 refills | Status: AC

## 2022-12-22 MED ORDER — ACETAMINOPHEN 325 MG PO TABS
650.0000 mg | ORAL_TABLET | Freq: Once | ORAL | Status: AC
  Administered 2022-12-22: 650 mg via ORAL
  Filled 2022-12-22: qty 2

## 2022-12-22 NOTE — ED Triage Notes (Signed)
Patient to ED via POV for generalized body aches- especially in back and lack of appetite. Ongoing x2 days.

## 2022-12-22 NOTE — ED Notes (Signed)
Pt verbalizes understanding of discharge instructions. Opportunity for questioning and answers were provided. Pt discharged from ED to home with significant other.   ? ?

## 2022-12-22 NOTE — ED Provider Notes (Signed)
[facility] Provider Note    Event Date/Time   First MD Initiated Contact with Patient 12/22/22 1646     (approximate)   History   Generalized Body Aches   HPI  Felicia Durham is a 62 y.o. female who presents today for evaluation of bodyaches, nasal congestion, sore throat, cough that began yesterday.  Patient denies any known sick contacts.  She denies abdominal pain, vomiting, diarrhea.  She reports that her cough is a dry cough.  She has not had any chest pain or shortness of breath.  No burning with urination.  She reports mild headache but no neck pain or stiffness.  No visual changes.  She has not taken anything for her symptoms today.  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   Injury of tendon of long head of right biceps 12/30/2014   Synovitis of right shoulder 12/30/2014   Right supraspinatus tenosynovitis 01/31/2014   Fall with injury 01/26/2014          Physical Exam   Triage Vital Signs: ED Triage Vitals  Encounter Vitals Group     BP 12/22/22 1519 (!) 166/86     Systolic BP Percentile --      Diastolic BP Percentile --      Pulse Rate 12/22/22 1519 97     Resp 12/22/22 1519 18     Temp 12/22/22 1519 99.4 F (37.4 C)     Temp src --      SpO2 12/22/22 1519 95 %     Weight 12/22/22 1518 222 lb 10.6 oz (101 kg)     Height 12/22/22 1518 5\' 3"  (1.6 m)     Head Circumference --      Peak Flow --      Pain Score 12/22/22 1518 10     Pain Loc --      Pain Education --      Exclude from Growth Chart --     Most recent vital signs: Vitals:   12/22/22 1912 12/22/22 1930  BP:  131/70 (!) 141/65  Pulse: 70 69  Resp: 18 18  Temp:    SpO2: 96% 100%    Physical Exam Vitals and nursing note reviewed.  Constitutional:      General: Awake and alert. No acute distress.    Appearance: Normal appearance. The patient is obese.  HENT:     Head: Normocephalic and atraumatic.     Mouth: Mucous membranes are moist. Uvula midline.  No tonsillar exudate.  No soft palate fluctuance.  No trismus.  No voice change.  No sublingual swelling.  No tender cervical lymphadenopathy.  No nuchal rigidity Eyes:     General: PERRL. Normal EOMs        Right eye: No discharge.        Left eye: No discharge.     Conjunctiva/sclera: Conjunctivae normal.  Cardiovascular:     Rate and Rhythm: Normal rate and regular rhythm.     Pulses: Normal pulses.  Pulmonary:     Effort: Pulmonary effort is normal. No respiratory distress.  No accessory muscle use, speaking easily in complete sentences.    Breath sounds:  Normal breath sounds.  Abdominal:     Abdomen is soft. There is no abdominal tenderness. No rebound or guarding. No distention. Musculoskeletal:        General: No swelling. Normal range of motion.     Cervical back: Normal range of motion and neck supple.  No nuchal rigidity Skin:    General: Skin is warm and dry.     Capillary Refill: Capillary refill takes less than 2 seconds.     Findings: No rash.  Neurological:     Mental Status: The patient is awake and alert.      ED Results / Procedures / Treatments   Labs (all labs ordered are listed, but only abnormal results are displayed) Labs Reviewed  BASIC METABOLIC PANEL - Abnormal; Notable for the following components:      Result Value   Sodium 131 (*)    Chloride 97 (*)    Glucose, Bld 122 (*)    Calcium 8.4 (*)    All other components within normal limits  URINALYSIS, W/ REFLEX TO CULTURE (INFECTION SUSPECTED) - Abnormal; Notable for the following components:   Color, Urine YELLOW (*)    APPearance HAZY (*)    Hgb  urine dipstick MODERATE (*)    Leukocytes,Ua LARGE (*)    Bacteria, UA MANY (*)    All other components within normal limits  RESP PANEL BY RT-PCR (RSV, FLU A&B, COVID)  [id number]  GROUP A STREP BY PCR  URINE CULTURE  CBC WITH DIFFERENTIAL/PLATELET     EKG     RADIOLOGY I independently reviewed and interpreted imaging and agree with radiologists findings.     PROCEDURES:  Critical Care performed:   Procedures   MEDICATIONS ORDERED IN ED: Medications  acetaminophen (TYLENOL) tablet 650 mg (650 mg Oral Given 12/22/22 1802)     IMPRESSION / MDM / ASSESSMENT AND PLAN / ED COURSE  I reviewed the triage vital signs and the nursing notes.   Differential diagnosis includes, but is not limited to, viral syndrome, COVID, flu, RSV, strep pharyngitis, pneumonia, urinary tract infection.  Patient is awake and alert, hemodynamically stable and afebrile.  She has normal oxygen saturation on room air.  COVID/flu/RSV swab obtained in triage is negative.  Strep is negative.  Labs obtained are overall reassuring.  Her chest x-ray does not reveal evidence of pneumonia.  However her urine is highly suggestive of infection with greater than 50 WBCs.  She has no abdominal tenderness on exam, no CVA tenderness, she is afebrile, I do not suspect pyelonephritis.  No blood in her urine, no flank pain, no history of kidney stones, do not suspect infected kidney stone.  She was darted on antibiotics.  We discussed return precautions and outpatient follow-up.  Patient understands and agrees with plan.  She was discharged in stable condition.   Patient's presentation is most consistent with acute complicated illness / injury requiring diagnostic workup.    FINAL CLINICAL IMPRESSION(S) / ED DIAGNOSES   Final diagnoses:  Acute cystitis without hematuria     Rx / DC Orders   ED Discharge Orders          Ordered    cefdinir (OMNICEF) 300 MG capsule  2 times daily        12/22/22 2018              Note:  This document was prepared using Dragon voice recognition software and may include unintentional dictation errors.   Jackelyn Hoehn, PA-C 12/22/22  2036    Concha Se, MD 12/25/22 0830

## 2022-12-22 NOTE — Discharge Instructions (Signed)
Your blood work, chest x-ray, and swabs are normal, however your urine reveals a urinary tract infection.  Please follow-up with your outpatient provider.  Please take the antibiotics as prescribed.  Please return for any new, worsening, or change in symptoms or other concerns.  It was a pleasure caring for you today.

## 2022-12-24 LAB — URINE CULTURE: Culture: >=100000 — AB

## 2023-06-26 ENCOUNTER — Other Ambulatory Visit: Payer: Self-pay | Admitting: Orthopedic Surgery

## 2023-07-14 ENCOUNTER — Encounter
Admission: RE | Admit: 2023-07-14 | Discharge: 2023-07-14 | Disposition: A | Discharge status: Home / Self Care | Source: Ambulatory Visit | Attending: Orthopedic Surgery | Admitting: Orthopedic Surgery

## 2023-07-14 ENCOUNTER — Other Ambulatory Visit: Payer: Self-pay

## 2023-07-14 VITALS — BP 139/91 | HR 74 | Resp 20 | Ht 62.0 in | Wt 209.0 lb

## 2023-07-14 DIAGNOSIS — Z01812 Encounter for preprocedural laboratory examination: Secondary | ICD-10-CM | POA: Diagnosis present

## 2023-07-14 DIAGNOSIS — Z01818 Encounter for other preprocedural examination: Secondary | ICD-10-CM

## 2023-07-14 HISTORY — DX: Depression, unspecified: F32.A

## 2023-07-14 LAB — SURGICAL PCR SCREEN
MRSA, PCR: NEGATIVE
Staphylococcus aureus: NEGATIVE

## 2023-07-14 LAB — URINALYSIS, ROUTINE W REFLEX MICROSCOPIC
Bacteria, UA: NONE SEEN
Bilirubin Urine: NEGATIVE
Glucose, UA: NEGATIVE mg/dL
Ketones, ur: NEGATIVE mg/dL
Leukocytes,Ua: NEGATIVE
Nitrite: NEGATIVE
Protein, ur: NEGATIVE mg/dL
Specific Gravity, Urine: 1.011 (ref 1.005–1.030)
WBC, UA: 0 WBC/hpf (ref 0–5)
pH: 7 (ref 5.0–8.0)

## 2023-07-14 NOTE — Patient Instructions (Addendum)
 Your procedure is scheduled on: Monday 07/28/23 To find out your arrival time, please call [phone] between 1PM - 3PM on:   Friday 07/25/23 Report to the Registration Desk on the 1st floor of the [facility]. Free Valet parking is available.  If your arrival time is 6:00 am, do not arrive before that time as the [facility] entrance doors do not open until 6:00 am.  REMEMBER: Instructions that are not followed completely may result in serious medical risk, up to and including death; or upon the discretion of your surgeon and anesthesiologist your surgery may need to be rescheduled.  Do not eat food after midnight the night before surgery.  No gum chewing or hard candies.  You may however, drink CLEAR liquids up to 2 hours before you are scheduled to arrive for your surgery. Do not drink anything within 2 hours of your scheduled arrival time.  Clear liquids include: - water  - apple juice without pulp - gatorade (not RED colors) - black coffee or tea (Do NOT add milk or creamers to the coffee or tea) Do NOT drink anything that is not on this list.  Type 1 and Type 2 diabetics should only drink water.  In addition, your doctor has ordered for you to drink the provided:  Ensure Pre-Surgery Clear Carbohydrate Drink  Drinking this carbohydrate drink up to two hours before surgery helps to reduce insulin resistance and improve patient outcomes. Please complete drinking 2 hours before scheduled arrival time.  One week prior to surgery: Stop Anti-inflammatories (NSAIDS) such as Advil, Aleve, Ibuprofen, Motrin, Naproxen, Naprosyn and Aspirin based products such as Excedrin, Goody's Powder, BC Powder. You may however, continue to take Tylenol  if needed for pain up until the day of surgery.  Stop ANY OVER THE COUNTER supplements and vitamins until after surgery.  Continue taking all prescribed medications daily thru Sunday.  TAKE ONLY THESE MEDICATIONS THE MORNING OF SURGERY WITH A SIP  OF WATER:  citalopram (CELEXA) 20 MG tablet  levothyroxine (SYNTHROID, LEVOTHROID) 125 MCG tablet   No Alcohol for 24 hours before or after surgery.  No Smoking including e-cigarettes for 24 hours before surgery.  No chewable tobacco products for at least 6 hours before surgery.  No nicotine patches on the day of surgery.  Do not use any recreational drugs for at least a week (preferably 2 weeks) before your surgery.  Please be advised that the combination of cocaine and anesthesia may have negative outcomes, up to and including death. If you test positive for cocaine, your surgery will be cancelled.  On the morning of surgery brush your teeth with toothpaste and water, you may rinse your mouth with mouthwash if you wish. Do not swallow any toothpaste or mouthwash.  Use CHG Soap or wipes as directed on instruction sheet.  Do not wear lotions, powders, or perfumes on the day of surgery  Do not shave body hair from the neck down 48 hours before surgery.  Wear comfortable clothing (specific to your surgery type) to the hospital.  Do not wear jewelry, make-up, hairpins, clips or nail polish or toenail polish  For welded (permanent) jewelry: bracelets, anklets, waist bands, etc.  Please have this removed prior to surgery.  If it is not removed, there is a chance that hospital personnel will need to cut it off on the day of surgery. Contact lenses, hearing aids and dentures may not be worn into surgery.  Do not bring valuables to the hospital. [facility]  is not responsible for any missing/lost belongings or valuables.   Notify your doctor if there is any change in your medical condition (cold, fever, infection).  If you are being discharged the day of surgery, you will not be allowed to drive home. You will need a responsible individual to drive you home and stay with you for 24 hours after surgery.   If you are taking public transportation, you will need to have a responsible  individual with you.  If you are being admitted to the hospital overnight, leave your suitcase in the car. After surgery it may be brought to your room.  In case of increased patient census, it may be necessary for you, the patient, to continue your postoperative care in the Same Day Surgery department.  After surgery, you can help prevent lung complications by doing breathing exercises.  Take deep breaths and cough every 1-2 hours. Your doctor may order a device called an Incentive Spirometer to help you take deep breaths. . Surgery Visitation Policy:  Patients undergoing a surgery or procedure may have two family members or support persons with them as long as the person is not COVID-19 positive or experiencing its symptoms.   Please call the Pre-admissions Testing Dept. at [phone] if you have any questions about these instructions.  Su procedimiento est programado para el lunes 30/6/25. Para conocer su hora de llegada, llame al [phone] entre la 1:00 p. m. y las 3:00 p. m. el viernes 27/6/25. Presntese en el mostrador de registro, ubicado en Information systems manager del [employer] Disponemos de servicio de valet parking gratuito.  Si su hora de llegada es a las 6:00 a. m., no llegue antes, ya que las puertas de [place] del [address] no abren [employer] 6:00 a. m.  RECUERDE: El incumplimiento de las instrucciones puede resultar en un riesgo mdico grave, incluso la [place]; o, a discrecin de su cirujano y Scientific laboratory technician, su ciruga podra tener que reprogramarse.  No consuma alimentos despus de la medianoche anterior a la ciruga.  No mastique chicle ni caramelos duros.  Sin embargo, puede beber lquidos claros hasta 2 horas antes de su hora de llegada programada para la [place]. No beba nada dentro de las 2 horas previas a su hora de llegada programada. Los lquidos claros incluyen: - Agua - Jugo de manzana sin pulpa - Gatorade (sin colorantes ROJOS) - Caf negro o t  (NO agregue leche ni crema al caf o t). NO beba nada que no est en esta lista.  Las personas con diabetes tipo 1 y tipo 2 solo deben Engineer, technical sales.  Adems, su mdico le ha indicado que beba la bebida preoperatoria de carbohidratos Ensure Clear Carbohidratos. Beber esta bebida de carbohidratos [employer] horas antes de la [place] a reducir la resistencia a la insulina y a [employer] del [place]. Por favor, termine de beberla 2 horas antes de la hora de llegada programada.  Una semana antes de la ciruga: Suspenda los antiinflamatorios (AINE) como Advil, Aleve, ibuprofeno, Motrin, naproxeno, Naprosyn y productos a base de aspirina como Excedrin, Goody's Powder y BC Powder. Sin embargo, puede Insurance risk surveyor Tylenol  si lo necesita para Marketing executive de la [place].  Suspenda cualquier suplemento o vitamina de venta libre hasta despus de la ciruga.  Contine tomando todos los medicamentos recetados diariamente hasta el domingo.  TOME SOLO ESTOS MEDICAMENTOS LA MAANA DE LA CIRUGA CON UN SORBO DE AGUA:  1. Citalopram (CELEXA) tableta  de 20 mg 2. Levotiroxina (SYNTHROID, LEVOTHROID) tableta de 125 mcg  No consuma alcohol durante las 24 horas anteriores o posteriores a la [place].  No fume, incluidos los cigarrillos electrnicos, durante las 24 horas anteriores a la [place]. No consumir tabaco masticable durante al menos 6 horas antes de la [place]. No usar parches de Optometrist de la [place].  No consuma drogas recreativas durante al menos una semana (preferiblemente dos) antes de la ciruga. Tenga en cuenta que la combinacin de cocana y anestesia puede tener consecuencias negativas, incluso la [place]. Si da positivo en la prueba de cocana, se cancelar la ciruga.  La maana de la ciruga, cepllese los dientes con pasta dental y agua; puede enjuagarse la boca con enjuague bucal si lo desea. No ingiera pasta dental ni enjuague bucal.  Use jabn o  toallitas CHG segn las instrucciones.  No use lociones, polvos ni perfumes el da de la [place].  No se afeite el vello corporal del cuello para abajo 48 horas antes de la [place].  [facility] al hospital ropa cmoda (especfica para el tipo de [place]).  No use joyas, maquillaje, horquillas, broches ni esmalte de uas ni de pies.  Para joyas soldadas (permanentes): pulseras, tobilleras, fajas, etc., quteselas antes de la ciruga. Si no se las [place], es posible que el personal del hospital tenga que cortarlas el da de la [place]. No se permite el uso de lentes de contacto, audfonos ni dentaduras postizas durante la [place].  No traiga objetos de valor al hospital. [facility] no se hace responsable de la prdida de pertenencias o objetos de valor.  Notifique a su mdico si observa algn cambio en su estado de salud (resfriado, fiebre, infeccin)  Si le dan de alta el da de la [place], no se le permitir conducir a casa. Necesitar que una persona responsable lo lleve a casa y lo acompae durante 24 horas despus de la [place].  Si utiliza transporte pblico, necesitar estar acompaado por una persona responsable.  Si permanece ingresado en el hospital durante la noche, deje su maleta en el auto. Despus de la [place], es posible que se la lleven a su habitacin.  En caso de un aumento en el nmero de [place], podra ser necesario que usted, [place], contine su atencin postoperatoria en el departamento de Ciruga Ambulatoria.  Despus de la [place], puede ayudar a prevenir complicaciones pulmonares realizando ejercicios de respiracin.  Respire profundamente y tosa cada 1 o 2 horas. Su mdico podra recetarle un dispositivo llamado espirmetro incentivador para ayudarle a respirar profundamente.  Poltica de visitas a la ciruga:  Los [employer] se sometan a [place] o procedimiento pueden estar acompaados por dos familiares o personas de apoyo, siempre que la persona  no sea positiva a COVID-19 ni presente sntomas.  Si tiene [employer], llame al [employer] de Preadmisin al [phone].       Instrucciones para baarse con el jabn de 5 CHG antes de la ciruga Pre-operative 5 CHG Bath Instructions  Usted puede desempear un papel clave en la reduccin del riesgo de infeccin despus de la ciruga. Su piel debe estar lo ms limpia posible de grmenes. Puede reducir el nmero de grmenes en su piel lavndose con un jabn de CHG (gluconato de clorhexidina) antes de la ciruga. El CHG es un jabn antisptico que [employer] grmenes y sigue matndolos incluso despus de que se bae.  NO LO UTILICE si usted tiene alergia a  la clorhexidina/CHG o a los jabones antibacterianos. Si la piel se le enrojece o se irrita, deje de usar el CHG e infrmelo a uno de nuestros enfermeros(as) al [phone].  Por favor, dchese con el jabn CHG comenzando 4 das antes de la ciruga segn el horario siguiente:   Thursday 07/24/23 - Monday 07/28/23 Jueves 26/06/25 - Lunes 30/06/25    Por favor, tenga en cuenta lo siguiente:  NO SE AFEITE, incluyendo las piernas y las [place], a Glass blower/designer del da de la primera ducha. Usted puede afeitarse la cara en cualquier momento antes o el mismo da de la [place]. Ponga sbanas limpias en su cama el da que comience a usar el jabn CHG. Use una toallita limpia (que no haya utilizado desde que se lav) para cada ducha. NO duerma con mascotas una vez que empiece a usar el CHG.  Instrucciones para la ducha con el CHG:  Si usted elige lavarse el pelo y las partes ntimas, lvese primero con el champ/jabn habitual.  Despus de usar el champ/jabn, enjuguese completamente el pelo y el cuerpo para eliminar los residuos del champ/jabn.  CIERRE el grifo y aplique unas 3 cucharadas (45 ml) del jabn CHG en una toallita LIMPIA. Aplquese el jabn CHG SOLAMENTE DESDE EL CUELLO HASTA LOS DEDOS DE LOS  PIES (lavndose durante 3-5 minutos).  NO USE el jabn CHG en la cara, las zonas ntimas, en heridas abiertas o en llagas.  Preste una atencin especial al rea donde le Carloyn Chi a operar.  Si le van a [employer], puede ser conveniente que otra persona le lave la espalda. Espere 2 minutos despus de haberse aplicado el jabn CHG, luego se lo puede enjuagar.  Squese con toquecitos ligeros usando una toalla limpia. Pngase ropa/pijama limpia. Si usted elige ponerse locin o crema, por favor, SOLAMENTE use las lociones compatibles con el CHG que se enumeran al reverso de este papel.    Instrucciones adicionales para Medical laboratory scientific officer de la ciruga: NO SE APLIQUE lociones, desodorantes, colonias ni perfumes.    Pngase ropa limpia y cmoda.   Cepllese los dientes.  Pregunte a su enfermero(a) antes de aplicarse cualquier medicamento recetado a la piel.      Lociones compatibles con el CHG   Aveeno Moisturizing Lotion (locin humectante Aveeno)  Cetaphil Moisturizing Cream (crema humectante Cetaphil)  Cetaphil Moisturizing Lotion (locin humectante Cetaphil)  Clairol Herbal Essence Moisturizing Lotion, Dry Skin (locin Clairol Herbal Essence humectante para piel seca)  Clairol Herbal Essence Moisturizing Lotion, Extra Dry Skin (locin Clairol Herbal Essence humectante para piel muy reseca)  Clairol Herbal Essence Moisturizing Lotion, Normal Skin (locin Clairol Herbal Essence humectante para piel normal)  Curel Age Defying Therapeutic Moisturizing Lotion with Alpha Hydroxy (locin Curel humectante teraputica para el antienvejecimiento con alfahidroxicidos)  Curel Extreme Care Body Lotion (locin corporal Curel para cuidados extremos)  Curel Soothing Hands Moisturizing Hand Lotion (locin Curel calmante para las manos)  Curel Therapeutic Moisturizing Cream, Fragrance-Free (crema Curel humectante teraputica sin fragancias)  Curel Therapeutic Moisturizing Lotion, Fragrance-Free (locin Curel  humectante teraputica sin fragancias)  Curel Therapeutic Moisturizing Lotion, Original Formula (locin Curel humectante teraputica de frmula original)  Eucerin Daily Replenishing Lotion (locin Eucerin rejuvenecedora diaria)  Eucerin Dry Skin Therapy Plus Alpha Hydroxy Crme (terapia Eucerin contra la piel seca con crema de alfahidroxicidos)  Eucerin Dry Skin Therapy Plus Alpha Hydroxy Lotion (locin Eucerin terapia contra la piel seca con alfahidroxicidos)  Eucerin Original Crme (crema Eucerin original)  Eucerin Original Lotion (locin Eucerin original)  Eucerin Plus Crme Eucerin Plus Lotion (locin o crema Eucerin Plus)  Eucerin TriLipid Replenishing Lotion (locin Eucerin rejuvenecedora Trilipid)  Keri Anti-Bacterial Hand Lotion (locin Keri antibacteriana para las manos)  Keri Deep Conditioning Original Lotion Dry Skin Formula Softly Scented (locin Keri original de hidratacin profunda, frmula para piel seca, con fragancia suave)  Keri Deep Conditioning Original Lotion, Fragrance Free Sensitive Skin Formula (locin Keri original de hidratacin profunda, frmula para piel sensible, sin fragancia)  Keri Lotion Fast Absorbing Fragrance Free Sensitive Skin Formula (locin Keri de absorcin rpida, frmula para piel sensible, sin fragancia)  Keri Lotion Fast Absorbing Softly Scented Dry Skin Formula (locin Keri de absorcin rpida, frmula para piel seca, con fragancia suave)  Keri Original Lotion (locin Keri original)  Keri Skin Renewal Lotion Keri Silky Smooth Lotion (lociones Keri de renovacin drmica y sedosamente suave)  Keri Silky Smooth Sensitive Skin Lotion (locin Keri sedosamente suave para piel sensible)  Nivea Body Creamy Conditioning Oil (aceite Nivea cremoso hidratante para el cuerpo)  Nivea Body Extra Enriched Lotion (locin Nivea extraenriquecida para el cuerpo)  Nivea Body Original Lotion (locin Nivea original para el cuerpo)  Nivea Body Sheer Moisturizing Lotion  Nivea Crme (locin Nivea Body Sheer humectante y crema Nivea)  Nivea Skin Firming Lotion (locin Nivea reafirmante)  NutraDerm 30 Skin Lotion (locin NutraDerm 30 para la piel)  NutraDerm Skin Lotion (locin NutraDerm para la piel)  NutraDerm Therapeutic Skin Cream (crema NutraDerm teraputica para la piel)  NutraDerm Therapeutic Skin Lotion (locin NutraDerm teraputica para la piel)  ProShield Protective Hand Cream (crema ProShield protectora para las manos)  Provon moisturizing lotion (locin humectante Provon)  Cmo utilizar un espirmetro de incentivo How to Use an Facilities manager  Un espirmetro de incentivo es un dispositivo que mide qu tan bien puede llenar los pulmones con cada respiracin. Aprender a respirar hondo y profundamente con este dispositivo puede ayudarlo a Pharmacologist los pulmones limpios y activos. El espirmetro puede ayudar a Dietitian o a Conservator, museum/gallery probabilidad de Risk manager respiratorios (pulmonares), especialmente infecciones. Se le puede pedir que utilice un espirmetro: Despus de [place]. Si tiene un problema pulmonar o antecedentes de tabaquismo. Luego de [employer] por un perodo prolongado sin poder moverse ni estar activo. Si el espirmetro incluye un indicador para Warehouse manager mayor nmero que ha alcanzado, su mdico o terapeuta especializado en terapia respiratoria lo ayudar a establecer una meta. Mantenga un registro del progreso segn se lo haya indicado el mdico. Cules son los riesgos? Respirar demasiado rpido Patent examiner o que se [place]. Tmese su tiempo para no marearse o sentir vahdos. Si siente dolor, puede ser necesario que tome analgsicos antes de usar el espirmetro de incentivo. Es ms difcil respirar hondo si tiene Engineer, mining. Cmo utilizar su espirmetro de Civil engineer, contracting en el borde de la cama o sobre una silla. Sostenga el espirmetro de incentivo, de modo que est en posicin vertical. Antes de usar  el espirmetro, exhale con normalidad. Coloque la boquilla en la boca. Asegrese de que sus labios estn cerrados hermticamente alrededor de la boquilla. Inhale por la boca con lentitud y tan profundamente como sea posible, para que el pistn o la bola se eleve hacia la parte superior de la cmara. Mantenga la respiracin durante 3 a 5 segundos, o durante el tiempo que sea posible. Si el espirmetro incluye un indicador, utilcelo para que gue su respiracin. Respire ms lentamente si el indicador sobrepasa las reas marcadas. Qutese la boquilla de la boca y  exhale con normalidad. El pistn o la bola volvern a la parte inferior de la cmara. Descanse durante unos segundos y luego repita los pasos 10 veces o ms. Tmese su tiempo y respire con normalidad entre las respiraciones profundas para no marearse o sentir vahdos. Hgalo cada 1 o 2 horas, cuando est despierto. Si el espirmetro Manufacturing engineer de objetivo para mostrar el mayor nmero que ha alcanzado (mejor esfuerzo), use esto como un objetivo para tratar de Film/video editor cada repeticin. Despus de cada sesin de 10 respiraciones profundas, tosa un par de veces. Esto ayudar a asegurarse de que sus pulmones estn limpios. Si tiene una incisin de [place] en el pecho o el abdomen, coloque una almohada o una toalla enrollada firmemente contra la incisin al toser. Esto puede ayudar a reducir Education officer, community respira profundamente y tose. Consejos generales Cuando pueda levantarse de la cama: [place] con frecuencia. Siga haciendo respiraciones profundas y tosiendo para [employer]. Siga usando el espirmetro de incentivo hasta que el mdico le diga que est bien dejar de usarlo. Si estuvo internado, es posible que le hayan indicado que contine con el uso del espirmetro en su casa. Comunquese con un mdico si: Tiene dificultad para Publishing copy. Tiene dificultad para Publishing copy con la frecuencia que  le han indicado. Los analgsicos no Oceanographer lo suficiente como para usar el espirmetro segn lo indicado. Tiene fiebre. Solicite ayuda de inmediato si: Le falta el aire. Escupe mucosidad con sangre de los pulmones al toser. Le sale lquido o sangre de una incisin despus de toser. Resumen El espirmetro de incentivo es un dispositivo que puede ayudarlo a aprender a Industrial/product designer hondo y profundamente para Pharmacologist los pulmones limpios y activos. Se le puede pedir que use un espirmetro despus de [place], si tiene un problema pulmonar o antecedentes de tabaquismo, o si ha [employer] un largo perodo. Utilice su espirmetro de incentivo segn las instrucciones cada 1 o 2 horas mientras est despierto. Si tiene una incisin en el pecho o el abdomen, coloque una almohada o una toalla enrollada firmemente contra la incisin al toser. Esto ayudar a Glass blower/designer. Obtenga ayuda de inmediato si tiene dificultad para Industrial/product designer, tose con mucosidad sanguinolenta o [employer] de la incisin cuando tose. Esta informacin no tiene Theme park manager el consejo del mdico. Asegrese de hacerle al mdico cualquier pregunta que tenga. Document Revised: 04/30/2019 Document Reviewed: 04/30/2019 Elsevier Patient Education  2023 [employer].

## 2023-07-28 ENCOUNTER — Encounter: Payer: Self-pay | Admitting: Orthopedic Surgery

## 2023-07-28 ENCOUNTER — Ambulatory Visit: Admitting: Anesthesiology

## 2023-07-28 ENCOUNTER — Other Ambulatory Visit: Payer: Self-pay

## 2023-07-28 ENCOUNTER — Encounter
Admission: RE | Disposition: A | Discharge status: Home / Self Care | Payer: Self-pay | Source: Home / Self Care | Attending: Orthopedic Surgery

## 2023-07-28 ENCOUNTER — Ambulatory Visit

## 2023-07-28 ENCOUNTER — Ambulatory Visit: Payer: Self-pay | Admitting: Urgent Care

## 2023-07-28 ENCOUNTER — Ambulatory Visit
Admission: RE | Admit: 2023-07-28 | Discharge: 2023-07-29 | Disposition: A | Discharge status: Home / Self Care | Attending: Orthopedic Surgery | Admitting: Orthopedic Surgery

## 2023-07-28 DIAGNOSIS — M1712 Unilateral primary osteoarthritis, left knee: Secondary | ICD-10-CM | POA: Insufficient documentation

## 2023-07-28 DIAGNOSIS — F419 Anxiety disorder, unspecified: Secondary | ICD-10-CM | POA: Insufficient documentation

## 2023-07-28 DIAGNOSIS — F32A Depression, unspecified: Secondary | ICD-10-CM | POA: Diagnosis not present

## 2023-07-28 DIAGNOSIS — Z87891 Personal history of nicotine dependence: Secondary | ICD-10-CM | POA: Diagnosis not present

## 2023-07-28 DIAGNOSIS — G8918 Other acute postprocedural pain: Secondary | ICD-10-CM | POA: Insufficient documentation

## 2023-07-28 DIAGNOSIS — I10 Essential (primary) hypertension: Secondary | ICD-10-CM | POA: Insufficient documentation

## 2023-07-28 DIAGNOSIS — Z96652 Presence of left artificial knee joint: Secondary | ICD-10-CM

## 2023-07-28 DIAGNOSIS — E871 Hypo-osmolality and hyponatremia: Secondary | ICD-10-CM | POA: Diagnosis not present

## 2023-07-28 HISTORY — PX: TOTAL KNEE ARTHROPLASTY: SHX125

## 2023-07-28 SURGERY — ARTHROPLASTY, KNEE, TOTAL
Anesthesia: Spinal | Site: Knee | Laterality: Left

## 2023-07-28 MED ORDER — CHLORHEXIDINE GLUCONATE 0.12 % MT SOLN
15.0000 mL | Freq: Once | OROMUCOSAL | Status: AC
Start: 2023-07-28 — End: 2023-07-28
  Administered 2023-07-28: 15 mL via OROMUCOSAL

## 2023-07-28 MED ORDER — ENOXAPARIN SODIUM 30 MG/0.3ML IJ SOSY
30.0000 mg | PREFILLED_SYRINGE | Freq: Two times a day (BID) | INTRAMUSCULAR | Status: DC
  Administered 2023-07-29: 30 mg via SUBCUTANEOUS
  Filled 2023-07-28: qty 0.3

## 2023-07-28 MED ORDER — TRAMADOL HCL 50 MG PO TABS
50.0000 mg | ORAL_TABLET | Freq: Four times a day (QID) | ORAL | Status: DC | PRN
  Administered 2023-07-28: 50 mg via ORAL
  Filled 2023-07-28: qty 1

## 2023-07-28 MED ORDER — TRANEXAMIC ACID-NACL 1000-0.7 MG/100ML-% IV SOLN
1000.0000 mg | INTRAVENOUS | Status: AC
  Administered 2023-07-28 (×2): 1000 mg via INTRAVENOUS

## 2023-07-28 MED ORDER — KETOROLAC TROMETHAMINE 15 MG/ML IJ SOLN
7.5000 mg | Freq: Four times a day (QID) | INTRAMUSCULAR | Status: DC
  Administered 2023-07-28 – 2023-07-29 (×3): 7.5 mg via INTRAVENOUS
  Filled 2023-07-28 (×3): qty 1

## 2023-07-28 MED ORDER — LEVOTHYROXINE SODIUM 125 MCG PO TABS
125.0000 ug | ORAL_TABLET | Freq: Every day | ORAL | Status: DC
  Administered 2023-07-29: 125 ug via ORAL
  Filled 2023-07-28: qty 1

## 2023-07-28 MED ORDER — PANTOPRAZOLE SODIUM 40 MG PO TBEC
40.0000 mg | DELAYED_RELEASE_TABLET | Freq: Every day | ORAL | Status: DC
  Administered 2023-07-28 – 2023-07-29 (×2): 40 mg via ORAL
  Filled 2023-07-28 (×2): qty 1

## 2023-07-28 MED ORDER — TRANEXAMIC ACID-NACL 1000-0.7 MG/100ML-% IV SOLN
INTRAVENOUS | Status: AC
  Filled 2023-07-28: qty 100

## 2023-07-28 MED ORDER — CITALOPRAM HYDROBROMIDE 20 MG PO TABS
20.0000 mg | ORAL_TABLET | Freq: Every day | ORAL | Status: DC
  Administered 2023-07-28 – 2023-07-29 (×2): 20 mg via ORAL
  Filled 2023-07-28 (×2): qty 1

## 2023-07-28 MED ORDER — BUPIVACAINE-EPINEPHRINE (PF) 0.25% -1:200000 IJ SOLN
INTRAMUSCULAR | Status: DC | PRN
  Administered 2023-07-28: 30 mL

## 2023-07-28 MED ORDER — TRANEXAMIC ACID-NACL 1000-0.7 MG/100ML-% IV SOLN
INTRAVENOUS | Status: AC
Start: 2023-07-28 — End: 2023-07-28
  Filled 2023-07-28: qty 100

## 2023-07-28 MED ORDER — IRBESARTAN 150 MG PO TABS
150.0000 mg | ORAL_TABLET | Freq: Every day | ORAL | Status: DC
  Administered 2023-07-28 – 2023-07-29 (×2): 150 mg via ORAL
  Filled 2023-07-28 (×2): qty 1

## 2023-07-28 MED ORDER — DEXAMETHASONE SODIUM PHOSPHATE 10 MG/ML IJ SOLN
8.0000 mg | Freq: Once | INTRAMUSCULAR | Status: AC
  Administered 2023-07-28: 8 mg via INTRAVENOUS

## 2023-07-28 MED ORDER — BUPIVACAINE LIPOSOME 1.3 % IJ SUSP
INTRAMUSCULAR | Status: DC | PRN
  Administered 2023-07-28: 20 mL

## 2023-07-28 MED ORDER — LACTATED RINGERS IV SOLN: INTRAVENOUS | Status: DC

## 2023-07-28 MED ORDER — DROPERIDOL 2.5 MG/ML IJ SOLN: 0.6250 mg | Freq: Once | INTRAMUSCULAR | Status: DC | PRN

## 2023-07-28 MED ORDER — CEFAZOLIN SODIUM-DEXTROSE 2-4 GM/100ML-% IV SOLN
2.0000 g | Freq: Four times a day (QID) | INTRAVENOUS | Status: AC
  Administered 2023-07-28 (×2): 2 g via INTRAVENOUS
  Filled 2023-07-28 (×2): qty 100

## 2023-07-28 MED ORDER — MENTHOL 3 MG MT LOZG: 1.0000 | LOZENGE | OROMUCOSAL | Status: DC | PRN

## 2023-07-28 MED ORDER — ACETAMINOPHEN 500 MG PO TABS
1000.0000 mg | ORAL_TABLET | Freq: Three times a day (TID) | ORAL | Status: DC
  Administered 2023-07-28 – 2023-07-29 (×2): 1000 mg via ORAL
  Filled 2023-07-28 (×3): qty 2

## 2023-07-28 MED ORDER — OLMESARTAN MEDOXOMIL-HCTZ 20-12.5 MG PO TABS: 1.0000 | ORAL_TABLET | Freq: Every day | ORAL | Status: DC

## 2023-07-28 MED ORDER — PHENYLEPHRINE HCL-NACL 20-0.9 MG/250ML-% IV SOLN
INTRAVENOUS | Status: AC
  Filled 2023-07-28: qty 250

## 2023-07-28 MED ORDER — PROPOFOL 500 MG/50ML IV EMUL
INTRAVENOUS | Status: DC | PRN
  Administered 2023-07-28: 75 ug/kg/min via INTRAVENOUS

## 2023-07-28 MED ORDER — SODIUM CHLORIDE (PF) 0.9 % IJ SOLN
INTRAMUSCULAR | Status: DC | PRN
Start: 2023-07-28 — End: 2023-07-28
  Administered 2023-07-28: 20 mL via INTRAVENOUS

## 2023-07-28 MED ORDER — MORPHINE SULFATE (PF) 4 MG/ML IV SOLN: 0.5000 mg | INTRAVENOUS | Status: DC | PRN

## 2023-07-28 MED ORDER — HYDROCODONE-ACETAMINOPHEN 5-325 MG PO TABS
1.0000 | ORAL_TABLET | ORAL | Status: DC | PRN
  Administered 2023-07-28: 1 via ORAL
  Filled 2023-07-28: qty 1

## 2023-07-28 MED ORDER — ACETAMINOPHEN 325 MG PO TABS: 325.0000 mg | ORAL_TABLET | Freq: Four times a day (QID) | ORAL | Status: DC | PRN

## 2023-07-28 MED ORDER — CEFAZOLIN SODIUM-DEXTROSE 2-4 GM/100ML-% IV SOLN
INTRAVENOUS | Status: AC
  Filled 2023-07-28: qty 100

## 2023-07-28 MED ORDER — CHLORHEXIDINE GLUCONATE 0.12 % MT SOLN
OROMUCOSAL | Status: AC
  Filled 2023-07-28: qty 15

## 2023-07-28 MED ORDER — SURGIPHOR WOUND IRRIGATION SYSTEM - OPTIME
TOPICAL | Status: DC | PRN
Start: 2023-07-28 — End: 2023-07-28
  Administered 2023-07-28: 450 mL via TOPICAL

## 2023-07-28 MED ORDER — CEFAZOLIN SODIUM-DEXTROSE 2-4 GM/100ML-% IV SOLN
2.0000 g | INTRAVENOUS | Status: AC
  Administered 2023-07-28: 2 g via INTRAVENOUS

## 2023-07-28 MED ORDER — PROPOFOL 1000 MG/100ML IV EMUL
INTRAVENOUS | Status: AC
  Filled 2023-07-28: qty 100

## 2023-07-28 MED ORDER — BUPIVACAINE LIPOSOME 1.3 % IJ SUSP
INTRAMUSCULAR | Status: AC
  Filled 2023-07-28: qty 20

## 2023-07-28 MED ORDER — HYDROCHLOROTHIAZIDE 12.5 MG PO TABS
12.5000 mg | ORAL_TABLET | Freq: Every day | ORAL | Status: DC
  Administered 2023-07-28 – 2023-07-29 (×2): 12.5 mg via ORAL
  Filled 2023-07-28 (×2): qty 1

## 2023-07-28 MED ORDER — SODIUM CHLORIDE (PF) 0.9 % IJ SOLN
INTRAMUSCULAR | Status: AC
  Filled 2023-07-28: qty 20

## 2023-07-28 MED ORDER — MIDAZOLAM HCL 5 MG/5ML IJ SOLN
INTRAMUSCULAR | Status: DC | PRN
  Administered 2023-07-28: 2 mg via INTRAVENOUS

## 2023-07-28 MED ORDER — DEXAMETHASONE SODIUM PHOSPHATE 10 MG/ML IJ SOLN
INTRAMUSCULAR | Status: AC
  Filled 2023-07-28: qty 1

## 2023-07-28 MED ORDER — ONDANSETRON HCL 4 MG PO TABS: 4.0000 mg | ORAL_TABLET | Freq: Four times a day (QID) | ORAL | Status: DC | PRN

## 2023-07-28 MED ORDER — FENTANYL CITRATE (PF) 100 MCG/2ML IJ SOLN
INTRAMUSCULAR | Status: AC
  Filled 2023-07-28: qty 2

## 2023-07-28 MED ORDER — BUPIVACAINE-EPINEPHRINE (PF) 0.25% -1:200000 IJ SOLN
INTRAMUSCULAR | Status: AC
  Filled 2023-07-28: qty 30

## 2023-07-28 MED ORDER — BUPIVACAINE HCL (PF) 0.5 % IJ SOLN
INTRAMUSCULAR | Status: DC | PRN
  Administered 2023-07-28: 3 mL

## 2023-07-28 MED ORDER — SODIUM CHLORIDE 0.9 % IV SOLN: INTRAVENOUS | Status: DC

## 2023-07-28 MED ORDER — ONDANSETRON HCL 4 MG/2ML IJ SOLN
INTRAMUSCULAR | Status: AC
  Filled 2023-07-28: qty 2

## 2023-07-28 MED ORDER — MIDAZOLAM HCL 2 MG/2ML IJ SOLN
INTRAMUSCULAR | Status: AC
  Filled 2023-07-28: qty 2

## 2023-07-28 MED ORDER — SODIUM CHLORIDE 0.9 % IR SOLN
Status: DC | PRN
  Administered 2023-07-28: 3000 mL

## 2023-07-28 MED ORDER — FENTANYL CITRATE (PF) 100 MCG/2ML IJ SOLN
INTRAMUSCULAR | Status: DC | PRN
  Administered 2023-07-28 (×4): 25 ug via INTRAVENOUS

## 2023-07-28 MED ORDER — ONDANSETRON HCL 4 MG/2ML IJ SOLN
INTRAMUSCULAR | Status: DC | PRN
  Administered 2023-07-28: 4 mg via INTRAVENOUS

## 2023-07-28 MED ORDER — ACETAMINOPHEN 10 MG/ML IV SOLN
INTRAVENOUS | Status: DC | PRN
  Administered 2023-07-28: 1000 mg via INTRAVENOUS

## 2023-07-28 MED ORDER — PHENYLEPHRINE HCL-NACL 20-0.9 MG/250ML-% IV SOLN
INTRAVENOUS | Status: DC | PRN
Start: 2023-07-28 — End: 2023-07-28
  Administered 2023-07-28: 20 ug/min via INTRAVENOUS

## 2023-07-28 MED ORDER — DOCUSATE SODIUM 100 MG PO CAPS
100.0000 mg | ORAL_CAPSULE | Freq: Two times a day (BID) | ORAL | Status: DC
  Administered 2023-07-28 – 2023-07-29 (×2): 100 mg via ORAL
  Filled 2023-07-28 (×2): qty 1

## 2023-07-28 MED ORDER — KETOROLAC TROMETHAMINE 30 MG/ML IJ SOLN
INTRAMUSCULAR | Status: DC | PRN
  Administered 2023-07-28: 30 mg via INTRAMUSCULAR

## 2023-07-28 MED ORDER — ORAL CARE MOUTH RINSE: 15.0000 mL | Freq: Once | OROMUCOSAL | Status: AC

## 2023-07-28 MED ORDER — ACETAMINOPHEN 10 MG/ML IV SOLN
INTRAVENOUS | Status: AC
Start: 2023-07-28 — End: 2023-07-28
  Filled 2023-07-28: qty 100

## 2023-07-28 MED ORDER — FENTANYL CITRATE (PF) 100 MCG/2ML IJ SOLN: 25.0000 ug | INTRAMUSCULAR | Status: DC | PRN

## 2023-07-28 MED ORDER — ONDANSETRON HCL 4 MG/2ML IJ SOLN: 4.0000 mg | Freq: Four times a day (QID) | INTRAMUSCULAR | Status: DC | PRN

## 2023-07-28 MED ORDER — METOCLOPRAMIDE HCL 5 MG/ML IJ SOLN: 5.0000 mg | Freq: Three times a day (TID) | INTRAMUSCULAR | Status: DC | PRN

## 2023-07-28 MED ORDER — PHENOL 1.4 % MT LIQD: 1.0000 | OROMUCOSAL | Status: DC | PRN

## 2023-07-28 MED ORDER — METOCLOPRAMIDE HCL 5 MG PO TABS: 5.0000 mg | ORAL_TABLET | Freq: Three times a day (TID) | ORAL | Status: DC | PRN

## 2023-07-28 SURGICAL SUPPLY — 62 items
BLADE PATELLA REAM PILOT HOLE (MISCELLANEOUS) IMPLANT
BLADE SAW 90X13X1.19 OSCILLAT (BLADE) IMPLANT
BLADE SAW SAG 25X90X1.19 (BLADE) ×1 IMPLANT
BLADE SAW SAG 29X58X.64 (BLADE) ×1 IMPLANT
BNDG ELASTIC 6INX 5YD STR LF (GAUZE/BANDAGES/DRESSINGS) ×1 IMPLANT
BOWL CEMENT MIX W/ADAPTER (MISCELLANEOUS) IMPLANT
BRUSH SCRUB EZ PLAIN DRY (MISCELLANEOUS) IMPLANT
CEMENT BONE R 1X40 (Cement) IMPLANT
CHLORAPREP W/TINT 26 (MISCELLANEOUS) ×2 IMPLANT
COOLER ICEMAN CLASSIC (MISCELLANEOUS) ×1 IMPLANT
CUFF TRNQT CYL 24X4X16.5-23 (TOURNIQUET CUFF) IMPLANT
CUFF TRNQT CYL 30X4X21-28X (TOURNIQUET CUFF) IMPLANT
CUFF TRNQT CYL 34X4.125X (TOURNIQUET CUFF) IMPLANT
DERMABOND ADVANCED .7 DNX12 (GAUZE/BANDAGES/DRESSINGS) ×1 IMPLANT
DRAPE INCISE IOBAN 66X45 STRL (DRAPES) IMPLANT
DRAPE SHEET LG 3/4 BI-LAMINATE (DRAPES) ×2 IMPLANT
DRSG MEPILEX SACRM 8.7X9.8 (GAUZE/BANDAGES/DRESSINGS) ×1 IMPLANT
DRSG OPSITE POSTOP 4X10 (GAUZE/BANDAGES/DRESSINGS) IMPLANT
DRSG OPSITE POSTOP 4X8 (GAUZE/BANDAGES/DRESSINGS) IMPLANT
ELECTRODE REM PT RTRN 9FT ADLT (ELECTROSURGICAL) ×1 IMPLANT
FEMUR CMTD CCR STD SZ7 L KNEE (Knees) IMPLANT
GLOVE BIO SURGEON STRL SZ8 (GLOVE) ×1 IMPLANT
GLOVE BIOGEL PI IND STRL 8 (GLOVE) ×1 IMPLANT
GLOVE PI ORTHO PRO STRL 7.5 (GLOVE) ×2 IMPLANT
GLOVE PI ORTHO PRO STRL SZ8 (GLOVE) ×2 IMPLANT
GLOVE SURG SYN 7.5 E (GLOVE) ×1 IMPLANT
GLOVE SURG SYN 7.5 PF PI (GLOVE) ×1 IMPLANT
GOWN SRG XL LVL 3 NONREINFORCE (GOWNS) ×1 IMPLANT
GOWN STRL REUS W/ TWL LRG LVL3 (GOWN DISPOSABLE) ×1 IMPLANT
GOWN STRL REUS W/ TWL XL LVL3 (GOWN DISPOSABLE) ×1 IMPLANT
HOOD PEEL AWAY T7 (MISCELLANEOUS) ×2 IMPLANT
KIT TURNOVER KIT A (KITS) ×1 IMPLANT
LINER TIB KNEE PS EF/6-9 12 LT (Liner) IMPLANT
MANIFOLD NEPTUNE II (INSTRUMENTS) ×1 IMPLANT
MARKER SKIN DUAL TIP RULER LAB (MISCELLANEOUS) ×1 IMPLANT
MAT ABSORB FLUID 56X50 GRAY (MISCELLANEOUS) ×1 IMPLANT
NDL HYPO 21X1.5 SAFETY (NEEDLE) ×1 IMPLANT
NEEDLE HYPO 21X1.5 SAFETY (NEEDLE) ×1 IMPLANT
PACK TOTAL KNEE (MISCELLANEOUS) ×1 IMPLANT
PAD ARMBOARD POSITIONER FOAM (MISCELLANEOUS) ×3 IMPLANT
PAD COLD UNI WRAP-ON (PAD) ×1 IMPLANT
PENCIL SMOKE EVACUATOR (MISCELLANEOUS) ×1 IMPLANT
PIN DRILL HDLS TROCAR 75 4PK (PIN) IMPLANT
SCREW FEMALE HEX FIX 25X2.5 (ORTHOPEDIC DISPOSABLE SUPPLIES) IMPLANT
SCREW HEX HEADED 3.5X27 DISP (ORTHOPEDIC DISPOSABLE SUPPLIES) IMPLANT
SLEEVE SCD COMPRESS KNEE MED (STOCKING) ×1 IMPLANT
SOL .9 NS 3000ML IRR UROMATIC (IV SOLUTION) ×1 IMPLANT
SOLUTION IRRIG SURGIPHOR (IV SOLUTION) ×1 IMPLANT
STEM POLY PAT PLY 35M KNEE (Knees) IMPLANT
STEM TIB ST PERS 14+30 (Stem) IMPLANT
STEM TIBIA 5 DEG SZ E L KNEE (Knees) IMPLANT
STOCKINETTE IMPERV 14X48 (MISCELLANEOUS) ×1 IMPLANT
SUT STRATAFIX 14 PDO 36 VLT (SUTURE) ×1 IMPLANT
SUT VIC AB 0 CT1 36 (SUTURE) ×1 IMPLANT
SUT VIC AB 2-0 CT2 27 (SUTURE) ×2 IMPLANT
SUTURE STRATA SPIR 4-0 18 (SUTURE) ×1 IMPLANT
SUTURE VICRYL 1-0 27IN ABS (SUTURE) ×1 IMPLANT
SYR 20ML LL LF (SYRINGE) ×2 IMPLANT
TAPE CLOTH 3X10 WHT NS LF (GAUZE/BANDAGES/DRESSINGS) ×1 IMPLANT
TIP FAN IRRIG PULSAVAC PLUS (DISPOSABLE) ×1 IMPLANT
TRAP FLUID SMOKE EVACUATOR (MISCELLANEOUS) ×1 IMPLANT
WATER STERILE IRR 1000ML POUR (IV SOLUTION) ×1 IMPLANT

## 2023-07-28 NOTE — Evaluation (Signed)
 Physical Therapy Evaluation Patient Details Name: Felicia Durham MRN: [id number] DOB: 10/19/1960 Today's Date: 07/28/2023  History of Present Illness  Pt is a 63 y/o F admitted on 07/28/23 for scheduled L TKA. PMH: depression, HTN, thyroid  disorder  Clinical Impression  Pt seen for PT evaluation with pt agreeable. Pt reports prior to admission she was working, living with her spouse, ambulatory without AD. On this date, PT provides pt with HEP handout & pt performs LLE strengthening/ROM exercises with PRN cuing for technique. Pt is able to complete supine>sit with supervision, sit<>stand with cuing re: hand placement, & ambulate with RW & CGA. Pt is making good progress with mobility, minimal c/o pain at end of session. Will follow pt acutely to progress mobility as able.        If plan is discharge home, recommend the following: Assistance with cooking/housework;A little help with bathing/dressing/bathroom;A little help with walking and/or transfers;Assist for transportation;Help with stairs or ramp for entrance   Can travel by private vehicle        Equipment Recommendations Rolling walker (2 wheels)  Recommendations for Other Services       Functional Status Assessment Patient has had a recent decline in their functional status and demonstrates the ability to make significant improvements in function in a reasonable and predictable amount of time.     Precautions / Restrictions Precautions Precautions: Fall Restrictions Weight Bearing Restrictions Per Provider Order: No      Mobility  Bed Mobility Overal bed mobility: Needs Assistance Bed Mobility: Supine to Sit     Supine to sit: Supervision, HOB elevated, Used rails          Transfers Overall transfer level: Needs assistance Equipment used: Rolling walker (2 wheels) Transfers: Sit to/from Stand Sit to Stand: Supervision           General transfer comment: education/cuing re: hand placement to push to  standing    Ambulation/Gait Ambulation/Gait assistance: Contact guard assist Gait Distance (Feet): 120 Feet Assistive device: Rolling walker (2 wheels) Gait Pattern/deviations: Decreased step length - left, Decreased step length - right, Decreased stride length Gait velocity: decreased     General Gait Details: cuing for increased knee flexion with improving ability to do so during swing phase  Stairs            Wheelchair Mobility     Tilt Bed    Modified Rankin (Stroke Patients Only)       Balance Overall balance assessment: Needs assistance Sitting-balance support: Feet supported Sitting balance-Leahy Scale: Fair     Standing balance support: During functional activity, Bilateral upper extremity supported, Reliant on assistive device for balance Standing balance-Leahy Scale: Fair                               Pertinent Vitals/Pain Pain Assessment Pain Assessment: Faces Faces Pain Scale: Hurts a little bit Pain Location: L knee during/after gait Pain Descriptors / Indicators: Grimacing, Guarding Pain Intervention(s): Monitored during session, Premedicated before session (polar care donned at end of session)    Home Living Family/patient expects to be discharged to:: Private residence Living Arrangements: Spouse/significant other (16 & 80 y/o granddaughters) Available Help at Discharge: Family Type of Home: House Home Access: Ramped entrance       Home Layout: One level Home Equipment: Firefighter      Prior Function Prior Level of Function : Working/employed;Driving  Mobility Comments: Works at [employer], 1 fall 2 months ago in her yard.       Extremity/Trunk Assessment   Upper Extremity Assessment Upper Extremity Assessment: Overall WFL for tasks assessed    Lower Extremity Assessment Lower Extremity Assessment: LLE deficits/detail LLE Deficits / Details: 2+/5 knee extension in sitting        Communication   Communication Factors Affecting Communication:  (utilized iPad interpreter Marnee # [phone]))    Cognition Arousal: Alert Behavior During Therapy: WFL for tasks assessed/performed   PT - Cognitive impairments: No apparent impairments                       PT - Cognition Comments: very pleasant lady Following commands: Intact       Cueing Cueing Techniques: Verbal cues     General Comments      Exercises Total Joint Exercises Ankle Circles/Pumps: AROM, Supine, Strengthening, Both, 10 reps Quad Sets: AROM, Supine, Strengthening, Left, 10 reps Short Arc Quad: AROM, Supine, Strengthening, Left, 10 reps Heel Slides: AROM, Supine, Strengthening, 10 reps Hip ABduction/ADduction: AROM, Supine, Strengthening, Left, 10 reps Straight Leg Raises: AROM, Supine, Left, Strengthening, 10 reps Long Arc Quad: AROM, Seated, Strengthening, Left, 10 reps Knee Flexion: AAROM, Seated, Left, 10 reps   Assessment/Plan    PT Assessment Patient needs continued PT services  PT Problem List Decreased strength;Decreased range of motion;Decreased activity tolerance;Decreased balance;Decreased mobility;Decreased knowledge of use of DME;Pain       PT Treatment Interventions DME instruction;Balance training;Gait training;Neuromuscular re-education;Stair training;Functional mobility training;Therapeutic activities;Therapeutic exercise;Patient/family education    PT Goals (Current goals can be found in the Care Plan section)  Acute Rehab PT Goals Patient Stated Goal: recover, go home PT Goal Formulation: With patient Time For Goal Achievement: 08/11/23 Potential to Achieve Goals: Good    Frequency BID     Co-evaluation               AM-PAC PT 6 Clicks Mobility  Outcome Measure Help needed turning from your back to your side while in a flat bed without using bedrails?: None Help needed moving from lying on your back to sitting on the side of a flat bed without  using bedrails?: A Little Help needed moving to and from a bed to a chair (including a wheelchair)?: A Little Help needed standing up from a chair using your arms (e.g., wheelchair or bedside chair)?: A Little Help needed to walk in hospital room?: A Little Help needed climbing 3-5 steps with a railing? : A Little 6 Click Score: 19    End of Session   Activity Tolerance: Patient tolerated treatment well Patient left: in chair;with call bell/phone within reach;with SCD's reapplied (SCD on RLE, polar care on LLE) Nurse Communication: Mobility status PT Visit Diagnosis: Muscle weakness (generalized) (M62.81);Other abnormalities of gait and mobility (R26.89)    Time: 8591-8561 PT Time Calculation (min) (ACUTE ONLY): 30 min   Charges:   PT Evaluation $PT Eval Low Complexity: 1 Low PT Treatments $Therapeutic Exercise: 8-22 mins PT General Charges $$ ACUTE PT VISIT: 1 Visit         Richerd Pinal, PT, DPT 07/28/23, 3:01 PM   [name] 07/28/2023, 3:00 PM

## 2023-07-28 NOTE — Transfer of Care (Signed)
 Immediate Anesthesia Transfer of Care Note  Patient: Felicia Durham  Procedure(s) Performed: ARTHROPLASTY, KNEE, TOTAL (Left: Knee)  Patient Location: PACU  Anesthesia Type:Spinal  Level of Consciousness: awake  Airway & Oxygen Therapy: Patient Spontanous Breathing  Post-op Assessment: Report given to RN and Post -op Vital signs reviewed and stable  Post vital signs: Reviewed and stable  Last Vitals:  Vitals Value Taken Time  BP 107/59 07/28/23 10:06  Temp    Pulse 76 07/28/23 10:10  Resp 13 07/28/23 10:10  SpO2 91 % 07/28/23 10:10  Vitals shown include unfiled device data.  Last Pain:  Vitals:   07/28/23 0622  TempSrc: Temporal  PainSc: 5          Complications: No notable events documented.

## 2023-07-28 NOTE — H&P (Signed)
 History of Present Illness: The patient is an 63 y.o. female seen prior to left total knee replacement. Patient reports severe pain up to a 10 out of 10 at its worst on a daily basis worse with activities such as walking getting up and down from a chair. She describes it as a dull aching pain describes activity. She takes Tylenol  with minimal relief. We did hyaluronic acid injection last year and she reports short-term relief and came back. It is functioning limiting on a daily basis and she is here today to discuss a more permanent solution for her knee arthritis as she is no longer using conservative treatments. The patient denies fevers, chills, numbness, tingling, shortness of breath, chest pain, recent illness, or any trauma.  BMI 38, A1c 5.7, non-smoker Denies any history of metal allergy or blood clot Video translator [phone] used for the encounter at time of surgical discussion  Past Medical History: Past Medical History:  Diagnosis Date  Depression  Hypertension  Thyroid  disorder   Past Surgical History: Past Surgical History:  Procedure Laterality Date  extensive arthroscopic debridement, arthroscopic subacromial decompression, and a mini-open biceps tendoesis, right shoulder Right 12/27/2014  Dr.Poggi  HYSTERECTOMY VAGINAL   Past Family History: Family History  Problem Relation Age of Onset  No Known Problems Mother  Alcohol abuse Father  Myocardial Infarction (Heart attack) Sister  No Known Problems Sister  No Known Problems Sister  Epilepsy Brother   Medications: Current Outpatient Medications  Medication Sig Dispense Refill  meloxicam (MOBIC) 15 MG tablet Take 15 mg by mouth once daily  celecoxib (CELEBREX) 200 MG capsule Take 1 capsule (200 mg total) by mouth 2 (two) times daily 30 capsule 0  cetirizine (ZYRTEC) 10 MG tablet Take 1 tablet (10 mg total) by mouth once daily 30 tablet 11  cholecalciferol (VITAMIN D3) 2,000 unit tablet Take 2,000 Units by mouth once  daily  citalopram (CELEXA) 20 MG tablet Take 1 tablet (20 mg total) by mouth once daily for 60 days 90 tablet 1  levothyroxine (SYNTHROID) 100 MCG tablet Take 1 tablet (100 mcg total) by mouth once daily 90 tablet 1  olmesartan-hydroCHLOROthiazide (BENICAR HCT) 20-12.5 mg tablet Take 1 tablet by mouth once daily 90 tablet 1  triamcinolone  (NASACORT AQ) 55 mcg nasal spray Place 2 sprays into both nostrils once daily 16.9 mL 1   No current facility-administered medications for this visit.   Allergies: No Known Allergies   Visit Vitals: There were no vitals filed for this visit.   Review of Systems:  A comprehensive 14 point ROS was performed, reviewed, and the pertinent orthopaedic findings are documented in the HPI.  Physical Exam: General/Constitutional: No apparent distress: well-nourished and well developed. Eyes: Pupils equal, round with synchronous movement. Pulmonary exam: Lungs clear to auscultation bilaterally no wheezing rales or rhonchi Cardiac exam: Regular rate and rhythm no obvious murmurs rubs or gallops. Integumentary: No impressive skin lesions present, except as noted in detailed exam. Neuro/Psych: Normal mood and affect, oriented to person, place and time. Expand All Collapse All  History of Present Illness: The patient is an 63 y.o. female seen in clinic today for evaluation of her bilateral knees. Patient reports she has had pain for 3 years in both knees which initially was worse on the left and more recently become more worse on the right. She reports pain when trying to stand and walk which is progressively worsening over time. She reports the pain gets up to a 10 out of 10 at  worst and is affecting her ability to stand at work. She is undergone injections in both knees the left one was done on 02/12/2022 and the right 1 on 04/24/2022. She reports she got a little bit of relief from the left knee and only 1 day of relief from the right knee injection. She reports a  grinding sensation and catching in her knee which makes her knee feel like it will give out at times. She has not done any formal physical therapy but has worked on home exercise. The patient denies fevers, chills, numbness, tingling, shortness of breath, chest pain, recent illness, or any trauma.  Translation performed by office translator Kenneth to [place].  Past Medical History: Past Medical History   Past Medical History:  Diagnosis Date  Depression  Hypertension  Thyroid  disorder     Past Surgical History: Past Surgical History   Past Surgical History:  Procedure Laterality Date  extensive arthroscopic debridement, arthroscopic subacromial decompression, and a mini-open biceps tendoesis, right shoulder Right 12/27/2014  Dr.Poggi  HYSTERECTOMY VAGINAL     Past Family History: Family History   Family History  Problem Relation Age of Onset  No Known Problems Mother  Alcohol abuse Father  Myocardial Infarction (Heart attack) Sister  No Known Problems Sister  No Known Problems Sister  Epilepsy Brother     Medications: Current Medications   Current Outpatient Medications  Medication Sig Dispense Refill  cholecalciferol (VITAMIN D3) 2,000 unit tablet Take 2,000 Units by mouth once daily  citalopram (CELEXA) 20 MG tablet Take 1 tablet (20 mg total) by mouth once daily for 60 days 30 tablet 1  hydroCHLOROthiazide (HYDRODIURIL) 25 MG tablet Take 1 tablet (25 mg total) by mouth once daily. 90 tablet 3  levothyroxine (SYNTHROID) 100 MCG tablet Take 100 mcg by mouth once daily  naproxen (NAPROSYN) 500 MG tablet Take 500 mg by mouth 2 (two) times daily as needed  venlafaxine (EFFEXOR-XR) 75 MG XR capsule Take 75 mg by mouth once daily  VICTOZA 2-PAK 0.6 mg/0.1 mL (18 mg/3 mL) pen injector Inject 1.2 mg subcutaneously once daily   No current facility-administered medications for this visit.     Allergies: Allergies  No Known Allergies    Visit Vitals:  Vitals:   07/12/22 0951  BP: (!) 156/100    Review of Systems:  A comprehensive 14 point ROS was performed, reviewed, and the pertinent orthopaedic findings are documented in the HPI.  Physical Exam: General/Constitutional: No apparent distress: well-nourished and well developed. Eyes: Pupils equal, round with synchronous movement. Respiratory: Non-labored breathing. Cardiac: Heart rate is regular. Integumentary: No impressive skin lesions present, except as noted in detailed exam. Neuro/Psych: Normal mood and affect, oriented to person, place and time.  Comprehensive Knee Exam: Gait Antalgic  Alignment Neutral   Inspection Right Left  Skin Normal appearance with no obvious deformity. No ecchymosis or erythema. Normal appearance with no obvious deformity. No ecchymosis or erythema.  Soft Tissue No focal soft tissue swelling No focal soft tissue swelling  Quad Atrophy None None   Palpation  Right Left  Tenderness Medial joint line tenderness to palpation Medial joint line tenderness palpation  Crepitus + patellofemoral and tibiofemoral crepitus + patellofemoral and tibiofemoral crepitus  Effusion None None   Range of Motion Right Left  Flexion 0-105 0-105  Extension Full knee extension without hyperextension Full knee extension without hyperextension   Ligamentous Exam Right Left  Lachman Normal Normal  Valgus 0 Normal Normal  Valgus 30 Normal Normal  Varus 0 Normal Normal  Varus 30 Normal Normal  Anterior Drawer Normal Normal  Posterior Drawer Normal Normal   Meniscal Exam Right Left  Hyperflexion Test Positive Positive  Hyperextension Test Positive Positive  McMurray's Positive Positive   Neurovascular Right Left  Quadriceps Strength 5/5 5/5  Hamstring Strength 5/5 5/5  Hip Abductor Strength 4/5 4/5  Distal Motor Normal Normal  Distal Sensory Normal light touch sensation Normal light touch sensation  Distal Pulses Normal Normal      Imaging Studies: I have  reviewed AP, lateral, sunrise and flexed PA weightbearing x-rays of the left knee which were performed on 06/13/2023 images reviewed by myself. There is tricompartmental degenerative changes most severe in the medial joint with bone-on-bone articulation osteophyte formation, sclerosis and bony erosion of the medial tibia. There is also significant patellofemoral and lateral joint space narrowing with osteophyte formation. Kellgren-Lawrence grade 4. The right knee is a similar appearance with medial joint space narrowing with osteophyte formation and sclerosis in all 3 compartments. Kellgren-Lawrence grade 4. No fractures or dislocations noted in either knee.  Assessment:  Bilateral knee osteoarthritis  Plan: Remedy is a 63year-old female who presents with bilateral knee bone on bone arthritis significantly more symptomatic in the left knee. Based upon the patient's continued symptoms and failure to respond to conservative treatment, I have recommended a left total knee replacement for this patient. A long discussion took place with the patient describing what a total joint replacement is and what the procedure would entail. A knee model, similar to the implants that will be used during the operation, was utilized to demonstrate the implants. Choices of implant manufactures were discussed and reviewed. The ability to secure the implant utilizing cement or cementless (press fit) fixation was discussed. The approach and exposure was discussed.   The hospitalization and post-operative care and rehabilitation were also discussed. The use of perioperative antibiotics and DVT prophylaxis were discussed. The risk, benefits and alternatives to a surgical intervention were discussed at length with the patient. The patient was also advised of risks related to the medical comorbidities and elevated body mass index (BMI). A lengthy discussion took place to review the most common complications including but not limited to:  stiffness, loss of function, complex regional pain syndrome, deep vein thrombosis, pulmonary embolus, heart attack, stroke, infection, wound breakdown, numbness, intraoperative fracture, damage to nerves, tendon,muscles, arteries or other blood vessels, death and other possible complications from anesthesia. The patient was told that we will take steps to minimize these risks by using sterile technique, antibiotics and DVT prophylaxis when appropriate and follow the patient postoperatively in the office setting to monitor progress. The possibility of recurrent pain, no improvement in pain and actual worsening of pain were also discussed with the patient.   Patient asked about and confirms no history of any reactions to metal or metal allergy in the past.  The discharge plan of care focused on the patient going home following surgery. The patient was encouraged to make the necessary arrangements to have someone stay with them when they are discharged home.   The benefits of surgery were discussed with the patient including the potential for improving the patient's current clinical condition through operative intervention. Alternatives to surgical intervention including continued conservative management were also discussed in detail. All questions were answered to the satisfaction of the patient. The patient participated and agreed to the plan of care as well as the use of the recommended implants for their total knee replacement surgery.  An information packet was given to the patient to review prior to surgery.    The patient received medical clearance for surgery. All questions answered patient agrees with the above plan for a left total knee replacement.  Portions of this record have been created using Scientist, clinical (histocompatibility and immunogenetics). Dictation errors have been sought, but may not have been identified and corrected.  Arthea Sheer MD

## 2023-07-28 NOTE — TOC Initial Note (Signed)
 Transition of Care [facility]) - Initial/Assessment Note    Patient Details  Name: Felicia Durham MRN: [id number] Date of Birth: 02/19/1960  [facility] CM/SW Contact:    Quintella Suzen Jansky, RN Phone Number: 07/28/2023, 3:18 PM  Clinical Narrative:                  Received message from bedside nurse, patient needs RW. Sent DME order to Jon with [employer] for processing.       Patient Goals and CMS Choice            Expected Discharge Plan and Services                                              Prior Living Arrangements/Services                       Activities of Daily Living   ADL Screening (condition at time of admission) Independently performs ADLs?: Yes (appropriate for developmental age) Is the patient deaf or have difficulty hearing?: No Does the patient have difficulty seeing, even when wearing glasses/contacts?: No Does the patient have difficulty concentrating, remembering, or making decisions?: No  Permission Sought/Granted                  Emotional Assessment              Admission diagnosis:  Primary osteoarthritis of left knee [M17.12] S/P TKR (total knee replacement), 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] Patient Active Problem List   Diagnosis Date Noted   S/P TKR (total knee replacement), 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   Varicose veins of both lower extremities with pain 06/09/2019   Other microscopic hematuria 05/18/2016   Prediabetes 05/15/2016   Acquired hypothyroidism 03/28/2016   Anxiety 03/28/2016   Empty sella (HCC) 03/28/2016   Essential hypertension 03/28/2016   Major depressive disorder, single episode, 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 [place] [address] Phone: [phone] Fax: [phone]  [facility] [address] [place] - [address] [place] [address] Phone: [phone] Fax: [phone]     Social Drivers of Health (SDOH) Social History: SDOH Screenings   Food Insecurity: No Food Insecurity (07/28/2023)  Housing: Low Risk  (07/28/2023)  Transportation Needs: No Transportation Needs (07/28/2023)  Utilities: Not At Risk (07/28/2023)  Financial Resource Strain: Low Risk  (07/01/2023)   Received from [facility]  Social Connections: Moderately Integrated (07/28/2023)  Tobacco Use: Medium Risk (07/28/2023)   SDOH Interventions:     Readmission Risk Interventions     No data to display

## 2023-07-28 NOTE — Interval H&P Note (Signed)
 Patient history and physical updated. Consent reviewed including risks, benefits, and alternatives to surgery with patient and family. Patient agrees with above plan to proceed with left total knee replacement.

## 2023-07-28 NOTE — Op Note (Signed)
 Patient Name: Felicia Durham  FMW:[id number]  Pre-Operative Diagnosis: Left knee Osteoarthritis  Post-Operative Diagnosis: (same)  Procedure: Left Total Knee Arthroplasty  Components/Implants: Femur: Persona  Size 7 PS   Tibia: Persona Size E w/ 14x92mm stem extension  Poly: 12mm CPS  Patella: 35x25mm symmetric  Femoral Valgus Cut Angle: 5 degrees  Distal Femoral Re-cut: none  Patella Resurfacing: yes   Date of Surgery: 07/28/2023  Surgeon: Arthea Sheer MD  Assistant: Dasie  RNFA (present and scrubbed throughout the case, critical for assistance with exposure, retraction, instrumentation, and closure)   Anesthesiologist: Dario  Anesthesia: Spinal   Tourniquet Time: 75 min  EBL: 50cc  IVF: 500cc  Complications: None   Brief history: The patient is a 63 year old female with a history of osteoarthritis of the left knee with pain limiting their range of motion and activities of daily living, which has failed multiple attempts at conservative therapy.  The risks and benefits of total knee arthroplasty as definitive surgical treatment were discussed with the patient, who opted to proceed with the operation.  After outpatient medical clearance and optimization was completed the patient was admitted to [facility] for the procedure.  All preoperative films were reviewed and an appropriate surgical plan was made prior to surgery. Preoperative range of motion was 0 to 105  Description of procedure: The patient was brought to the operating room where laterality was confirmed by all those present to be the left side.   Spinal anesthesia was administered and the patient received an intravenous dose of antibiotics for surgical prophylaxis and a dose of tranexamic acid.  Patient is positioned supine on the operating room table with all bony prominences well-padded.  A well-padded tourniquet was applied to the left thigh.  The knee was then prepped and draped in usual  sterile fashion with multiple layers of adhesive and nonadhesive drapes.  All of those present in the operating room participated in a surgical timeout laterality and patient were confirmed.   An Esmarch was wrapped around the extremity and the leg was elevated and the knee flexed.  The tourniquet was inflated to a pressure of 250 mmHg. The Esmarch was removed and the leg was brought down to full extension.  The patella and tibial tubercle identified and outlined using a marking pen and a midline skin incision was made with a knife carried through the subcutaneous tissue down to the extensor retinaculum.  After exposure of the extensor mechanism the medial parapatellar arthrotomy was performed with a scalpel and electrocautery extending down medial and distal to the tibial tubercle taking care to avoid incising the patellar tendon.   A standard medial release was performed over the proximal tibia. The MCL was found to be attenuated and stretched over a large tibial osteophyte. The knee was brought into extension in order to excise the fat pad taking care not to damage the patella tendon.  The superior soft tissue was removed from the anterior surface of the distal femur to visualize for the procedure.  The knee was then brought into flexion with the patella subluxed laterally and subluxing the tibia anteriorly.  The ACL/PCL were transected and removed with electrocautery and additional soft tissue was removed from the proximal surface of the tibia to fully expose.   An extramedullary tibial cutting guide was then applied to the leg with a spring-loaded ankle clamp placed around the distal tibia just above the malleoli the angulation of the guide was adjusted to give some posterior slope in  the tibial resection with an appropriate varus/valgus alignment.  The resection guide was then pinned to the proximal tibia and the proximal tibial surface was resected with an oscillating saw.  Careful attention was paid to  ensure the blade did not disrupt any of the soft tissues including any lateral or medial ligament.  Attention was then turned to the femur, with the knee slightly flexed a opening drill was used to enter the medullary canal of the femur.  After removing the drill marrow was suctioned out to decompress the distal femur.  An intramedullary femoral guide was then inserted into the drill hole and the alignment guide was seated firmly against the distal end of the medial femoral condyle.  The distal femoral cutting guide was then attached and pinned securely to the anterior surface of the femur and the intramedullary rod and alignment guide was removed.  Distal femur resection was then performed with an oscillating saw with retractors protecting medial and laterally.   The distal cutting block was then removed and the extension gap was checked with a spacer.  Extension gap was found to be appropriately sized to accommodate the spacer block.   The femoral sizing guide was then placed securely into the posterior condyles of the femur and the femoral size was measured and determined to be 7.  The size 7; 4-in-1 cutting guide was placed in position and secured with 2 pins.  The anterior posterior and chamfer resections were then performed with an oscillating saw.  Bony fragments and osteophytes were then removed.  Using a lamina spreader the posterior medial and lateral condyles were checked for additional osteophytes and posterior soft tissue remnants.  Any remaining meniscus was removed at this time.  Periarticular injection was performed in the meniscal rims and posterior capsule with aspiration performed to ensure no intravascular injection.   The tibia was then exposed and the tibial trial was pinned onto the plateau after confirming appropriate orientation and rotation.  Using the drill bushing the tibia was prepared to the appropriate drill depth.  Tibial broach impactor was then driven through the punch guide  using a mallet.  The femoral trial component was then inserted onto the femur. The femoral box was then cut with a oscillating saw and trochlea was placed.  A trial tibial polyethylene bearing was then placed and the knee was reduced.  The knee achieved full extension with no hyperextension and was found to be balanced in flexion and extension with the trials in place.  The knee was then brought into full extension the patella was everted and held with 2 Kocher clamps.  The articular surface of the patella was then resected with an patella reamer and saw after careful measurement with a caliper.  The patella was then prepared with the drill guide and a trial patella was placed.  The knee was then taken through range of motion and it was found that the patella articulated appropriately with the trochlea and good patellofemoral motion without subluxation.    The correct final components for implantation were confirmed and opened by the circulator nurse.  A bone plug was fashioned from the patient's bone cuts to plug the intramedullary femoral canal access hole and was tamped into place.  The prepared surfaces of the patella femur and tibia were cleaned with pulsatile lavage to remove all blood fat and other material and then the surfaces were dried.  2 bags of cement were mixed under vacuum and the components were cemented into place.  Excess cement was removed with curettes and forceps. A trial polyethylene tibial component was placed and the knee was brought into extension to allow the cement to set.  At this time the periarticular injection cocktail was placed in the soft tissues surrounding the knee.  After full curing of the cement the balance of the knee was checked again and the final polyethylene size was confirmed. The tibial component was irrigated and locking mechanism checked to ensure it was clear of debris. The real polyethylene tibial component was implanted and the knee was brought through a range  of motion.   The knee was then irrigated with copious amount of normal saline via pulsatile lavage to remove all loose bodies and other debris.  The knee was then irrigated with surgiphor betadine based wash and reirrigated with saline.  The tourniquet was then dropped and all bleeding vessels were identified and coagulated.  The arthrotomy was approximated with #1 Vicryl and closed with #1 Stratafix suture.  The knee was brought into slight flexion and the subcutaneous tissues were closed with 0 Vicryl, 2-0 Vicryl and a running subcuticular 4-0 stratafix barbed suture.  Skin was then glued with Dermabond.  A sterile adhesive dressing was then placed along with a sequential compression device to the calf, a Ted stocking, and a cryotherapy cuff.   Sponge, needle, and Lap counts were all correct at the end of the case.   The patient was transferred off of the operating room table to a hospital bed, good pulses were found distally on the operative side.  The patient was transferred to the recovery room in stable condition.

## 2023-07-28 NOTE — Anesthesia Preprocedure Evaluation (Signed)
 Anesthesia Evaluation  Patient identified by MRN, date of birth, ID band Patient awake    Reviewed: Allergy & Precautions, H&P , NPO status , Patient's Chart, lab work & pertinent test results, reviewed documented beta blocker date and time   History of Anesthesia Complications Negative for: history of anesthetic complications  Airway Mallampati: I  TM Distance: >3 FB Neck ROM: full    Dental   Pulmonary neg pulmonary ROS, former smoker   Pulmonary exam normal breath sounds clear to auscultation       Cardiovascular Exercise Tolerance: Good hypertension, (-) angina (-) Past MI and (-) Cardiac Stents Normal cardiovascular exam(-) dysrhythmias (-) Valvular Problems/Murmurs Rhythm:regular Rate:Normal     Neuro/Psych  PSYCHIATRIC DISORDERS Anxiety Depression    negative neurological ROS     GI/Hepatic negative GI ROS, Neg liver ROS,,,  Endo/Other  neg diabetesHypothyroidism    Renal/GU negative Renal ROS  negative genitourinary   Musculoskeletal   Abdominal   Peds  Hematology negative hematology ROS (+)   Anesthesia Other Findings Past Medical History: No date: Anxiety No date: Depression No date: GERD (gastroesophageal reflux disease) No date: Hypertension No date: Hypothyroidism   Reproductive/Obstetrics negative OB ROS                             Anesthesia Physical Anesthesia Plan  ASA: 2  Anesthesia Plan: Spinal   Post-op Pain Management:    Induction:   PONV Risk Score and Plan: 2 and Propofol  infusion, TIVA and Treatment may vary due to age or medical condition  Airway Management Planned: Natural Airway and Simple Face Mask  Additional Equipment:   Intra-op Plan:   Post-operative Plan:   Informed Consent: I have reviewed the patients History and Physical, chart, labs and discussed the procedure including the risks, benefits and alternatives for the proposed anesthesia  with the patient or authorized representative who has indicated his/her understanding and acceptance.     Dental Advisory Given  Plan Discussed with: Anesthesiologist, CRNA and Surgeon  Anesthesia Plan Comments:         Anesthesia Quick Evaluation

## 2023-07-28 NOTE — Anesthesia Procedure Notes (Signed)
 Spinal  Patient location during procedure: OR Start time: 07/28/2023 7:45 AM End time: 07/28/2023 7:49 AM Reason for block: surgical anesthesia Staffing Performed: anesthesiologist  Anesthesiologist: Dario Barter, MD Performed by: Dario Barter, MD Authorized by: Dario Barter, MD   Preanesthetic Checklist Completed: patient identified, IV checked, site marked, risks and benefits discussed, surgical consent, monitors and equipment checked, pre-op evaluation and timeout performed Spinal Block Patient position: sitting Prep: ChloraPrep Patient monitoring: heart rate, continuous pulse ox, blood pressure and cardiac monitor Approach: midline Location: L3-4 Injection technique: single-shot Needle Needle type: Whitacre and Introducer  Needle gauge: 24 G Needle length: 9 cm Assessment Sensory level: T10 Events: CSF return Additional Notes Sterile aseptic technique used throughout the procedure.  Negative paresthesia. Negative blood return. Positive free-flowing CSF. Expiration date of kit checked and confirmed. Patient tolerated procedure well, without complications.

## 2023-07-28 NOTE — Plan of Care (Signed)
   Problem: Activity: Goal: Ability to avoid complications of mobility impairment will improve Outcome: Progressing   Problem: Clinical Measurements: Goal: Postoperative complications will be avoided or minimized Outcome: Progressing   Problem: Pain Management: Goal: Pain level will decrease with appropriate interventions Outcome: Progressing

## 2023-07-29 ENCOUNTER — Emergency Department
Admission: EM | Admit: 2023-07-29 | Discharge: 2023-07-30 | Disposition: A | Discharge status: Home / Self Care | Source: Home / Self Care | Attending: Emergency Medicine | Admitting: Emergency Medicine

## 2023-07-29 ENCOUNTER — Encounter: Payer: Self-pay | Admitting: Orthopedic Surgery

## 2023-07-29 ENCOUNTER — Other Ambulatory Visit: Payer: Self-pay

## 2023-07-29 DIAGNOSIS — E871 Hypo-osmolality and hyponatremia: Secondary | ICD-10-CM | POA: Insufficient documentation

## 2023-07-29 DIAGNOSIS — G8918 Other acute postprocedural pain: Secondary | ICD-10-CM | POA: Insufficient documentation

## 2023-07-29 LAB — CBC
HCT: 33.2 % — ABNORMAL LOW (ref 36.0–46.0)
Hemoglobin: 10.8 g/dL — ABNORMAL LOW (ref 12.0–15.0)
MCH: 29.8 pg (ref 26.0–34.0)
MCHC: 32.5 g/dL (ref 30.0–36.0)
MCV: 91.5 fL (ref 80.0–100.0)
Platelets: 275 10*3/uL (ref 150–400)
RBC: 3.63 MIL/uL — ABNORMAL LOW (ref 3.87–5.11)
RDW: 12.9 % (ref 11.5–15.5)
WBC: 14.4 10*3/uL — ABNORMAL HIGH (ref 4.0–10.5)
nRBC: 0 % (ref 0.0–0.2)

## 2023-07-29 LAB — BASIC METABOLIC PANEL WITH GFR
Anion gap: 8 (ref 5–15)
BUN: 22 mg/dL (ref 8–23)
CO2: 28 mmol/L (ref 22–32)
Calcium: 8.9 mg/dL (ref 8.9–10.3)
Chloride: 103 mmol/L (ref 98–111)
Creatinine, Ser: 0.51 mg/dL (ref 0.44–1.00)
GFR, Estimated: >60 mL/min (ref >60)
Glucose, Bld: 134 mg/dL — ABNORMAL HIGH (ref 70–99)
Potassium: 5 mmol/L (ref 3.5–5.1)
Sodium: 139 mmol/L (ref 135–145)

## 2023-07-29 MED ORDER — HYDROCODONE-ACETAMINOPHEN 5-325 MG PO TABS: 1.0000 | ORAL_TABLET | ORAL | 0 refills | Status: DC | PRN

## 2023-07-29 MED ORDER — ENOXAPARIN SODIUM 40 MG/0.4ML IJ SOSY: 40.0000 mg | PREFILLED_SYRINGE | INTRAMUSCULAR | 0 refills | Status: DC

## 2023-07-29 MED ORDER — TRAMADOL HCL 50 MG PO TABS: 50.0000 mg | ORAL_TABLET | Freq: Four times a day (QID) | ORAL | 0 refills | Status: DC | PRN

## 2023-07-29 MED ORDER — ONDANSETRON HCL 4 MG/2ML IJ SOLN
4.0000 mg | Freq: Once | INTRAMUSCULAR | Status: AC
  Administered 2023-07-30: 4 mg via INTRAVENOUS
  Filled 2023-07-29: qty 2

## 2023-07-29 MED ORDER — ENOXAPARIN SODIUM 30 MG/0.3ML IJ SOSY: 30.0000 mg | PREFILLED_SYRINGE | Freq: Two times a day (BID) | INTRAMUSCULAR | 0 refills | Status: DC

## 2023-07-29 MED ORDER — MORPHINE SULFATE (PF) 4 MG/ML IV SOLN
4.0000 mg | Freq: Once | INTRAVENOUS | Status: AC
  Administered 2023-07-30: 4 mg via INTRAVENOUS
  Filled 2023-07-29: qty 1

## 2023-07-29 NOTE — Discharge Summary (Signed)
 Physician Discharge Summary  Subjective: 1 Day Post-Op Procedure(s) (LRB): ARTHROPLASTY, KNEE, TOTAL (Left) Patient reports pain as mild.   Patient seen in rounds with Dr. Lorelle. Patient is well, and has had no acute complaints or problems Patient is ready to go home with home health physical therapy  Physician Discharge Summary  Patient ID: Felicia Durham MRN: [id number] DOB/AGE: Aug 11, 1960 63 y.o.  Admit date: 07/28/2023 Discharge date: 07/29/2023  Admission Diagnoses:  Discharge Diagnoses:  Principal Problem:   S/P TKR (total knee replacement), left   Discharged Condition: fair  Hospital Course: The patient is postop day 1 from a left total knee arthroplasty.  She is doing well since surgery.  The patient ambulated 120 feet with physical therapy.  She is ready to go home after physical therapy this morning.  Her pain is manageable.  Her vitals are stable.  Treatments: surgery:   Left Total Knee Arthroplasty   Components/Implants: Femur: Persona  Size 7 PS   Tibia: Persona Size E w/ 14x69mm stem extension  Poly: 12mm CPS  Patella: 35x72mm symmetric   Femoral Valgus Cut Angle: 5 degrees   Distal Femoral Re-cut: none   Patella Resurfacing: yes    Date of Surgery: 07/28/2023   Surgeon: Arthea Lorelle MD   Assistant: Dasie  RNFA (present and scrubbed throughout the case, critical for assistance with exposure, retraction, instrumentation, and closure)     Anesthesiologist: Dario   Anesthesia: Spinal    Tourniquet Time: 75 min   EBL: 50cc   IVF: 500cc   Complications: None  Discharge Exam: Blood pressure 114/68, pulse 64, temperature 97.7 F (36.5 C), temperature source Temporal, resp. rate 16, height 5' 2 (1.575 m), weight 94.8 kg, SpO2 95%.   Disposition:  Discharge home with home health physical therapy.  Discharge home after completing physical therapy goals.  Allergies as of 07/29/2023   No Known Allergies      Medication List     TAKE  these medications    citalopram 20 MG tablet Commonly known as: CELEXA Take 20 mg by mouth daily.   enoxaparin 40 MG/0.4ML injection Commonly known as: LOVENOX Inject 0.4 mLs (40 mg total) into the skin daily for 14 days.   HYDROcodone-acetaminophen  5-325 MG tablet Commonly known as: NORCO/VICODIN Take 1-2 tablets by mouth every 4 (four) hours as needed for severe pain (pain score 7-10).   levothyroxine 100 MCG tablet Commonly known as: SYNTHROID Take 100 mcg by mouth daily before breakfast.   olmesartan-hydrochlorothiazide 20-12.5 MG tablet Commonly known as: BENICAR HCT Take 1 tablet by mouth daily.   traMADol 50 MG tablet Commonly known as: ULTRAM Take 1 tablet (50 mg total) by mouth every 6 (six) hours as needed for moderate pain (pain score 4-6).               Durable Medical Equipment  (From admission, onward)           Start     Ordered   07/28/23 1436  For home use only DME Walker rolling  Once       Question Answer Comment  Walker: With 5 Inch Wheels   Patient needs a walker to treat with the following condition Arthritis of left knee      07/28/23 1435             Signed: Ellias Mcelreath 07/29/2023, 6:54 AM   Objective: Vital signs in last 24 hours: Temp:  [97.1 F (36.2 C)-98 F (36.7 C)] 97.7 F (36.5 C) (  07/01 0403) Pulse Rate:  [64-99] 64 (07/01 0403) Resp:  [13-21] 16 (07/01 0403) BP: (105-141)/(59-92) 114/68 (07/01 0403) SpO2:  [90 %-96 %] 95 % (07/01 0403) Weight:  [94.8 kg] 94.8 kg (06/30 2148)  Intake/Output from previous day:  Intake/Output Summary (Last 24 hours) at 07/29/2023 0654 Last data filed at 07/28/2023 2241 Gross per 24 hour  Intake 1243.33 ml  Output 850 ml  Net 393.33 ml    Intake/Output this shift: Total I/O In: 100 [IV Piggyback:100] Out: -   Labs: Recent Labs    07/29/23 0443  HGB 10.8*   Recent Labs    07/29/23 0443  WBC 14.4*  RBC 3.63*  HCT 33.2*  PLT 275   Recent Labs    07/29/23 0443   NA 139  K 5.0  CL 103  CO2 28  BUN 22  CREATININE 0.51  GLUCOSE 134*  CALCIUM 8.9   No results for input(s): LABPT, INR in the last 72 hours.  EXAM: General - Patient is Alert and Oriented Extremity - Neurovascular intact Sensation intact distally Dorsiflexion/Plantar flexion intact Compartment soft Incision - clean, dry, no drainage Motor Function -plantarflexion and dorsiflexion intact.  Able to straight leg raise independently.  Assessment/Plan: 1 Day Post-Op Procedure(s) (LRB): ARTHROPLASTY, KNEE, TOTAL (Left) Procedure(s) (LRB): ARTHROPLASTY, KNEE, TOTAL (Left) Past Medical History:  Diagnosis Date   Anxiety    Depression    GERD (gastroesophageal reflux disease)    Hypertension    Hypothyroidism    Principal Problem:   S/P TKR (total knee replacement), left  Estimated body mass index is 38.23 kg/m as calculated from the following:   Height as of this encounter: 5' 2 (1.575 m).   Weight as of this encounter: 94.8 kg. Advance diet Up with therapy D/C IV fluids Discharge home with home health Diet - Regular diet Follow up - in 2 weeks Activity - WBAT Disposition - Home Condition Upon Discharge - Stable DVT Prophylaxis - Lovenox and TED hose  Krystal Doyne, PA-C Orthopaedic Surgery 07/29/2023, 6:54 AM

## 2023-07-29 NOTE — Plan of Care (Signed)
 Progressing towards discharge

## 2023-07-29 NOTE — Progress Notes (Signed)
 Physical Therapy Treatment Patient Details Name: Felicia Durham MRN: [id number] DOB: September 26, 1960 Today's Date: 07/29/2023   History of Present Illness Pt is a 63 y/o F admitted on 07/28/23 for scheduled L TKA. PMH: depression, HTN, thyroid  disorder    PT Comments  Pt seen for PT tx with pt pleasant, agreeable, declining interpreter during session. Pt is able to complete bed mobility with mod I, sit<>stand with supervision fade to mod I, & ambulate with RW & supervision. Pt negotiates stairs with CGA without LOB with min cuing re: compensatory pattern. Pt is making good progress with mobility & pt anticipating d/c home today.    If plan is discharge home, recommend the following: Assistance with cooking/housework;A little help with bathing/dressing/bathroom;A little help with walking and/or transfers;Assist for transportation;Help with stairs or ramp for entrance   Can travel by private vehicle        Equipment Recommendations  Rolling walker (2 wheels)    Recommendations for Other Services       Precautions / Restrictions Precautions Precautions: Fall Restrictions Weight Bearing Restrictions Per Provider Order: No     Mobility  Bed Mobility Overal bed mobility: Needs Assistance Bed Mobility: Supine to Sit, Sit to Supine     Supine to sit: Modified independent (Device/Increase time), HOB elevated, Used rails Sit to supine: Modified independent (Device/Increase time), HOB elevated, Used rails        Transfers Overall transfer level: Needs assistance Equipment used: Rolling walker (2 wheels) Transfers: Sit to/from Stand Sit to Stand: Supervision, Modified independent (Device/Increase time)           General transfer comment: supervision fade to mod I, sit<>stand from EOB, toilet    Ambulation/Gait Ambulation/Gait assistance: Supervision Gait Distance (Feet): 200 Feet (+ 200 ft) Assistive device: Rolling walker (2 wheels) Gait Pattern/deviations: Decreased  step length - left, Decreased step length - right, Decreased stride length Gait velocity: decreased     General Gait Details: decreased L hip/knee flexion during swing phase, slowly improves with gait   Stairs Stairs: Yes Stairs assistance: Contact guard assist Stair Management: Two rails, Step to pattern Number of Stairs: 4 (6) General stair comments: PT provides cuing/demonstration re: stair negotiation   Wheelchair Mobility     Tilt Bed    Modified Rankin (Stroke Patients Only)       Balance Overall balance assessment: Needs assistance Sitting-balance support: Feet supported Sitting balance-Leahy Scale: Good     Standing balance support: During functional activity, No upper extremity supported Standing balance-Leahy Scale: Fair                              Musician Factors Affecting Communication:  (declined interpreter)  Cognition Arousal: Alert Behavior During Therapy: WFL for tasks assessed/performed   PT - Cognitive impairments: No apparent impairments                       PT - Cognition Comments: very pleasant lady Following commands: Intact      Cueing Cueing Techniques: Verbal cues  Exercises      General Comments        Pertinent Vitals/Pain Pain Assessment Pain Assessment: Faces Faces Pain Scale: Hurts a little bit Pain Location: posterior L knee during gait Pain Descriptors / Indicators: Discomfort Pain Intervention(s): Monitored during session    Home Living  Prior Function            PT Goals (current goals can now be found in the care plan section) Acute Rehab PT Goals Patient Stated Goal: recover, go home PT Goal Formulation: With patient Time For Goal Achievement: 08/11/23 Potential to Achieve Goals: Good Progress towards PT goals: Progressing toward goals    Frequency    BID      PT Plan      Co-evaluation              AM-PAC  PT 6 Clicks Mobility   Outcome Measure  Help needed turning from your back to your side while in a flat bed without using bedrails?: None Help needed moving from lying on your back to sitting on the side of a flat bed without using bedrails?: None Help needed moving to and from a bed to a chair (including a wheelchair)?: None Help needed standing up from a chair using your arms (e.g., wheelchair or bedside chair)?: None Help needed to walk in hospital room?: A Little Help needed climbing 3-5 steps with a railing? : A Little 6 Click Score: 22    End of Session   Activity Tolerance: Patient tolerated treatment well Patient left: in bed;with call bell/phone within reach;with SCD's reapplied (SCD on RLE, polar care on LLE) Nurse Communication: Mobility status PT Visit Diagnosis: Muscle weakness (generalized) (M62.81);Other abnormalities of gait and mobility (R26.89)     Time: 9144-9080 PT Time Calculation (min) (ACUTE ONLY): 24 min  Charges:    $Therapeutic Activity: 23-37 mins PT General Charges $$ ACUTE PT VISIT: 1 Visit                     Richerd Pinal, PT, DPT 07/29/23, 10:24 AM    [name] 07/29/2023, 10:23 AM

## 2023-07-29 NOTE — Progress Notes (Signed)
 DISCHARGE NOTE:  Spanish Interpreter at bedside to translate. Pt given discharge instructions and verbalized understanding. Walker sent with pt. Pt wheeled to car by staff, husband providing transportation home.

## 2023-07-29 NOTE — ED Triage Notes (Signed)
 Pt in via POV, reports having knee surgery yesterday, presents now w/ unbearable pain.  Reports taking RX'd Norco and Tramadol but without any relief.  Patient repeats, I just can't bear this pain anymore.  Patient appears  uncomfortable, hypertensive in triage, other vitals WDL.

## 2023-07-29 NOTE — Anesthesia Postprocedure Evaluation (Signed)
 Anesthesia Post Note  Patient: Felicia Durham  Procedure(s) Performed: ARTHROPLASTY, KNEE, TOTAL (Left: Knee)  Patient location during evaluation: Nursing Unit Anesthesia Type: Spinal Level of consciousness: awake Pain management: pain level controlled Respiratory status: spontaneous breathing Postop Assessment: no headache Anesthetic complications: no   No notable events documented.   Last Vitals:  Vitals:   07/29/23 0403 07/29/23 0745  BP: 114/68 122/74  Pulse: 64 62  Resp: 16 16  Temp: 36.5 C 36.7 C  SpO2: 95% 98%    Last Pain:  Vitals:   07/29/23 0745  TempSrc: Oral  PainSc: 0-No pain                 Shona Earnie Fare

## 2023-07-29 NOTE — Discharge Instructions (Signed)
 TOTAL KNEE REPLACEMENT POSTOPERATIVE DIRECTIONS  Knee Rehabilitation, Guidelines Following Surgery  Results after knee surgery are often greatly improved when you follow the exercise, range of motion and muscle strengthening exercises prescribed by your doctor. Safety measures are also important to protect the knee from further injury. Any time any of these exercises cause you to have increased pain or swelling in your knee joint, decrease the amount until you are comfortable again and slowly increase them. If you have problems or questions, call your caregiver or physical therapist for advice.   HOME CARE INSTRUCTIONS  Remove items at home which could result in a fall. This includes throw rugs or furniture in walking pathways.  ICE using the Polar Care unit to the affected knee every three hours for 30 minutes at a time and then as needed for pain and swelling.  Place a dry towel or pillow case over the knee before applying the Polar Care Unit.  Continue to use ice on the knee for pain and swelling from surgery. You may notice swelling that will progress down to the foot and ankle.  This is normal after surgery.  Elevate the leg when you are not up walking on it.   Continue to use the breathing machine which will help keep your temperature down.  It is common for your temperature to cycle up and down following surgery, especially at night when you are not up moving around and exerting yourself.  The breathing machine keeps your lungs expanded and your temperature down. Do not place pillow under knee, focus on keeping the knee straight while resting  DIET You may resume your previous home diet once your are discharged from the hospital.  DRESSING / WOUND CARE / SHOWERING The dressing can remain intact for 7 days.  At 7 days the dressing can be changed to a new one.  Home health physical therapy can assist with dressing change.  After that the dressing can be changed as needed. You need to keep your  wound dry after being discharged home.  Just keep the incision dry and apply a dry gauze dressing on daily. Change the surgical dressing only if needed and reapply a dry dressing each time.  ACTIVITY Walk with your walker as instructed. Use walker as long as suggested by your caregivers. Avoid periods of inactivity such as sitting longer than an hour when not asleep. This helps prevent blood clots.  You may resume a sexual relationship in one month or when given the OK by your doctor.  You may return to work once you are cleared by your doctor.  Do not drive a car for 6 weeks or until released by you surgeon.  Do not drive while taking narcotics.  WEIGHT BEARING Weight bearing as tolerated with assist device (walker, cane, etc) as directed, use it as long as suggested by your surgeon or therapist, typically at least 4-6 weeks.  POSTOPERATIVE CONSTIPATION PROTOCOL Constipation - defined medically as fewer than three stools per week and severe constipation as less than one stool per week.  One of the most common issues patients have following surgery is constipation.  Even if you have a regular bowel pattern at home, your normal regimen is likely to be disrupted due to multiple reasons following surgery.  Combination of anesthesia, postoperative narcotics, change in appetite and fluid intake all can affect your bowels.  In order to avoid complications following surgery, here are some recommendations in order to help you during your recovery period.  Colace (docusate) - Pick up an over-the-counter form of Colace or another stool softener and take twice a day as long as you are requiring postoperative pain medications.  Take with a full glass of water daily.  If you experience loose stools or diarrhea, hold the colace until you stool forms back up.  If your symptoms do not get better within 1 week or if they get worse, check with your doctor.  Dulcolax (bisacodyl) - Pick up over-the-counter and  take as directed by the product packaging as needed to assist with the movement of your bowels.  Take with a full glass of water.  Use this product as needed if not relieved by Colace only.   MiraLax (polyethylene glycol) - Pick up over-the-counter to have on hand.  MiraLax is a solution that will increase the amount of water in your bowels to assist with bowel movements.  Take as directed and can mix with a glass of water, juice, soda, coffee, or tea.  Take if you go more than two days without a movement. Do not use MiraLax more than once per day. Call your doctor if you are still constipated or irregular after using this medication for 7 days in a row.  If you continue to have problems with postoperative constipation, please contact the office for further assistance and recommendations.  If you experience the worst abdominal pain ever or develop nausea or vomiting, please contact the office immediatly for further recommendations for treatment.  ITCHING  If you experience itching with your medications, try taking only a single pain pill, or even half a pain pill at a time.  You can also use Benadryl  over the counter for itching or also to help with sleep.   TED HOSE STOCKINGS Wear the elastic stockings on both legs for six weeks following surgery during the day but you may remove then at night for sleeping.  MEDICATIONS See your medication summary on the "After Visit Summary" that the nursing staff will review with you prior to discharge.  You may have some home medications which will be placed on hold until you complete the course of blood thinner medication.  It is important for you to complete the blood thinner medication as prescribed by your surgeon.  Continue your approved medications as instructed at time of discharge.  PRECAUTIONS If you experience chest pain or shortness of breath - call 911 immediately for transfer to the hospital emergency department.  If you develop a fever greater  that 101 F, purulent drainage from wound, increased redness or drainage from wound, foul odor from the wound/dressing, or calf pain - CONTACT YOUR SURGEON.                                                   FOLLOW-UP APPOINTMENTS Make sure you keep all of your appointments after your operation with your surgeon and caregivers. You should call the office at the above phone number and make an appointment for approximately two weeks after the date of your surgery or on the date instructed by your surgeon outlined in the After Visit Summary.   RANGE OF MOTION AND STRENGTHENING EXERCISES  Rehabilitation of the knee is important following a knee injury or an operation. After just a few days of immobilization, the muscles of the thigh which control the knee become weakened and  shrink (atrophy). Knee exercises are designed to build up the tone and strength of the thigh muscles and to improve knee motion. Often times heat used for twenty to thirty minutes before working out will loosen up your tissues and help with improving the range of motion but do not use heat for the first two weeks following surgery. These exercises can be done on a training (exercise) mat, on the floor, on a table or on a bed. Use what ever works the best and is most comfortable for you Knee exercises include:  Leg Lifts - While your knee is still immobilized in a splint or cast, you can do straight leg raises. Lift the leg to 60 degrees, hold for 3 sec, and slowly lower the leg. Repeat 10-20 times 2-3 times daily. Perform this exercise against resistance later as your knee gets better.  Quad and Hamstring Sets - Tighten up the muscle on the front of the thigh (Quad) and hold for 5-10 sec. Repeat this 10-20 times hourly. Hamstring sets are done by pushing the foot backward against an object and holding for 5-10 sec. Repeat as with quad sets.  Leg Slides: Lying on your back, slowly slide your foot toward your buttocks, bending your knee up  off the floor (only go as far as is comfortable). Then slowly slide your foot back down until your leg is flat on the floor again. Angel Wings: Lying on your back spread your legs to the side as far apart as you can without causing discomfort.  A rehabilitation program following serious knee injuries can speed recovery and prevent re-injury in the future due to weakened muscles. Contact your doctor or a physical therapist for more information on knee rehabilitation.   IF YOU ARE TRANSFERRED TO A SKILLED REHAB FACILITY If the patient is transferred to a skilled rehab facility following release from the hospital, a list of the current medications will be sent to the facility for the patient to continue.  When discharged from the skilled rehab facility, please have the facility set up the patient's Home Health Physical Therapy prior to being released. Also, the skilled facility will be responsible for providing the patient with their medications at time of release from the facility to include their pain medication, the muscle relaxants, and their blood thinner medication. If the patient is still at the rehab facility at time of the two week follow up appointment, the skilled rehab facility will also need to assist the patient in arranging follow up appointment in our office and any transportation needs.  MAKE SURE YOU:  Understand these instructions.  Get help right away if you are not doing well or get worse.    Pick up stool softner and laxative for home use following surgery while on pain medications. Do not submerge incision under water. Please use good hand washing techniques while changing dressing each day. May shower starting three days after surgery. Please use a clean towel to pat the incision dry following showers. Continue to use ice for pain and swelling after surgery. Do not use any lotions or creams on the incision until instructed by your surgeon.

## 2023-07-29 NOTE — Progress Notes (Signed)
  Subjective: 1 Day Post-Op Procedure(s) (LRB): ARTHROPLASTY, KNEE, TOTAL (Left) Patient reports pain as mild.   Patient is well, and has had no acute complaints or problems Plan is to go Home after hospital stay. Negative for chest pain and shortness of breath Fever: no Gastrointestinal: Negative for nausea and vomiting  Objective: Vital signs in last 24 hours: Temp:  [97.1 F (36.2 C)-98 F (36.7 C)] 97.7 F (36.5 C) (07/01 0403) Pulse Rate:  [64-99] 64 (07/01 0403) Resp:  [13-21] 16 (07/01 0403) BP: (105-141)/(59-92) 114/68 (07/01 0403) SpO2:  [90 %-96 %] 95 % (07/01 0403) Weight:  [94.8 kg] 94.8 kg (06/30 2148)  Intake/Output from previous day:  Intake/Output Summary (Last 24 hours) at 07/29/2023 0642 Last data filed at 07/28/2023 2241 Gross per 24 hour  Intake 1243.33 ml  Output 850 ml  Net 393.33 ml    Intake/Output this shift: Total I/O In: 100 [IV Piggyback:100] Out: -   Labs: Recent Labs    07/29/23 0443  HGB 10.8*   Recent Labs    07/29/23 0443  WBC 14.4*  RBC 3.63*  HCT 33.2*  PLT 275   Recent Labs    07/29/23 0443  NA 139  K 5.0  CL 103  CO2 28  BUN 22  CREATININE 0.51  GLUCOSE 134*  CALCIUM 8.9   No results for input(s): LABPT, INR in the last 72 hours.   EXAM General - Patient is Alert and Oriented Extremity - Neurovascular intact Sensation intact distally Dorsiflexion/Plantar flexion intact Compartment soft Dressing/Incision - clean, dry, no drainage Motor Function - intact, moving foot and toes well on exam.  Able to do a straight leg raise independently  Past Medical History:  Diagnosis Date   Anxiety    Depression    GERD (gastroesophageal reflux disease)    Hypertension    Hypothyroidism     Assessment/Plan: 1 Day Post-Op Procedure(s) (LRB): ARTHROPLASTY, KNEE, TOTAL (Left) Principal Problem:   S/P TKR (total knee replacement), left  Estimated body mass index is 38.23 kg/m as calculated from the following:    Height as of this encounter: 5' 2 (1.575 m).   Weight as of this encounter: 94.8 kg. Advance diet Up with therapy D/C IV fluids Discharge home with home health  DVT Prophylaxis - Lovenox and TED hose Weight-Bearing as tolerated to left leg  Krystal Doyne, PA-C Orthopaedic Surgery 07/29/2023, 6:42 AM

## 2023-07-30 ENCOUNTER — Emergency Department

## 2023-07-30 LAB — CBC
HCT: 34.3 % — ABNORMAL LOW (ref 36.0–46.0)
Hemoglobin: 11.3 g/dL — ABNORMAL LOW (ref 12.0–15.0)
MCH: 29.9 pg (ref 26.0–34.0)
MCHC: 32.9 g/dL (ref 30.0–36.0)
MCV: 90.7 fL (ref 80.0–100.0)
Platelets: 269 10*3/uL (ref 150–400)
RBC: 3.78 MIL/uL — ABNORMAL LOW (ref 3.87–5.11)
RDW: 13.2 % (ref 11.5–15.5)
WBC: 13.6 10*3/uL — ABNORMAL HIGH (ref 4.0–10.5)
nRBC: 0 % (ref 0.0–0.2)

## 2023-07-30 LAB — COMPREHENSIVE METABOLIC PANEL WITH GFR
ALT: 17 U/L (ref 0–44)
AST: 33 U/L (ref 15–41)
Albumin: 3.6 g/dL (ref 3.5–5.0)
Alkaline Phosphatase: 61 U/L (ref 38–126)
Anion gap: 10 (ref 5–15)
BUN: 16 mg/dL (ref 8–23)
CO2: 24 mmol/L (ref 22–32)
Calcium: 8.1 mg/dL — ABNORMAL LOW (ref 8.9–10.3)
Chloride: 93 mmol/L — ABNORMAL LOW (ref 98–111)
Creatinine, Ser: 0.6 mg/dL (ref 0.44–1.00)
GFR, Estimated: >60 mL/min (ref >60)
Glucose, Bld: 134 mg/dL — ABNORMAL HIGH (ref 70–99)
Potassium: 3.7 mmol/L (ref 3.5–5.1)
Sodium: 127 mmol/L — ABNORMAL LOW (ref 135–145)
Total Bilirubin: 0.8 mg/dL (ref 0.0–1.2)
Total Protein: 6.5 g/dL (ref 6.5–8.1)

## 2023-07-30 MED ORDER — SODIUM CHLORIDE 0.9 % IV BOLUS
500.0000 mL | Freq: Once | INTRAVENOUS | Status: AC
  Administered 2023-07-30: 500 mL via INTRAVENOUS

## 2023-07-30 MED ORDER — SODIUM CHLORIDE 0.9 % IV BOLUS: 500.0000 mL | Freq: Once | INTRAVENOUS | Status: DC

## 2023-07-30 MED ORDER — OXYCODONE-ACETAMINOPHEN 5-325 MG PO TABS
1.0000 | ORAL_TABLET | Freq: Once | ORAL | Status: AC
  Administered 2023-07-30: 1 via ORAL
  Filled 2023-07-30: qty 1

## 2023-07-30 MED ORDER — POLYETHYLENE GLYCOL 3350 17 G PO PACK: 17.0000 g | PACK | Freq: Every day | ORAL | 0 refills | Status: DC

## 2023-07-30 MED ORDER — DOCUSATE SODIUM 100 MG PO CAPS: 100.0000 mg | ORAL_CAPSULE | Freq: Two times a day (BID) | ORAL | 2 refills | Status: DC

## 2023-07-30 MED ORDER — OXYCODONE-ACETAMINOPHEN 5-325 MG PO TABS: 1.0000 | ORAL_TABLET | Freq: Three times a day (TID) | ORAL | 0 refills | Status: DC | PRN

## 2023-07-30 NOTE — ED Provider Notes (Signed)
 [facility] Provider Note    Event Date/Time   First MD Initiated Contact with Patient 07/29/23 2345     (approximate)   History   Post-op Problem (/)  HPI  Felicia Durham is a 63 y.o. female status post TKR on the left on June 30.  Was discharged yesterday and had been doing okay but reports her pain worsened last night and she is not getting any relief from her tramadol and Vicodin.      Physical Exam   Triage Vital Signs: ED Triage Vitals  Encounter Vitals Group     BP 07/29/23 2336 (!) 129/108     Girls Systolic BP Percentile --      Girls Diastolic BP Percentile --      Boys Systolic BP Percentile --      Boys Diastolic BP Percentile --      Pulse Rate 07/29/23 2336 74     Resp 07/29/23 2336 20     Temp 07/29/23 2336 98.5 F (36.9 C)     Temp Source 07/29/23 2336 Oral     SpO2 07/29/23 2336 99 %     Weight 07/29/23 2337 98 kg (216 lb)     Height 07/29/23 2337 1.524 m (5')     Head Circumference --      Peak Flow --      Pain Score 07/29/23 2337 10     Pain Loc --      Pain Education --      Exclude from Growth Chart --     Most recent vital signs: Vitals:   07/29/23 2353 07/30/23 0200  BP: (!) 155/85 (!) 141/75  Pulse: 83 76  Resp: 18 16  Temp:    SpO2: 94% 97%     General: Awake, no distress.  CV:  Good peripheral perfusion.  Resp:  Normal effort.  Abd:  No distention.  Other:  No erythema of the left leg, normal pulses distally   ED Results / Procedures / Treatments   Labs (all labs ordered are listed, but only abnormal results are displayed) Labs Reviewed  CBC - Abnormal; Notable for the following components:      Result Value   WBC 13.6 (*)    RBC 3.78 (*)    Hemoglobin 11.3 (*)    HCT 34.3 (*)    All other components within normal limits  COMPREHENSIVE METABOLIC PANEL WITH GFR - Abnormal; Notable for the following components:   Sodium 127 (*)    Chloride 93 (*)    Glucose, Bld 134 (*)     Calcium 8.1 (*)    All other components within normal limits     EKG     RADIOLOGY     PROCEDURES:  Critical Care performed:   Procedures   MEDICATIONS ORDERED IN ED: Medications  morphine (PF) 4 MG/ML injection 4 mg (4 mg Intravenous Given 07/30/23 0001)  ondansetron  (ZOFRAN ) injection 4 mg (4 mg Intravenous Given 07/30/23 0001)  sodium chloride 0.9 % bolus 500 mL (500 mLs Intravenous New Bag/Given 07/30/23 0204)  oxyCODONE -acetaminophen  (PERCOCET/ROXICET) 5-325 MG per tablet 1 tablet (1 tablet Oral Given 07/30/23 0206)     IMPRESSION / MDM / ASSESSMENT AND PLAN / ED COURSE  I reviewed the triage vital signs and the nursing notes. Patient's presentation is most consistent with exacerbation of chronic illness.  Patient status post TKR with significant pain.  Overall reassuring exam, not consistent with infection, normal pulses distally, doubt  DVT but certainly possibility given recent TKR.  I suspect inadequate pain control, will treat with IV morphine, IV Zofran , obtain ultrasound and reevaluate.  ----------------------------------------- 2:03 AM on 07/30/2023 ----------------------------------------- Patient's pain is much better, ultrasound negative for DVT.  Lab work demonstrates hyponatremia, review of record demonstrates she has had this before.  IV fluids infusing, anticipate discharge with close outpatient follow-up for sodium recheck.      FINAL CLINICAL IMPRESSION(S) / ED DIAGNOSES   Final diagnoses:  Post-operative pain  Hyponatremia     Rx / DC Orders   ED Discharge Orders          Ordered    oxyCODONE -acetaminophen  (PERCOCET) 5-325 MG tablet  Every 8 hours PRN        07/30/23 0215    docusate sodium (COLACE) 100 MG capsule  2 times daily        07/30/23 0215    polyethylene glycol (MIRALAX) 17 g packet  Daily        07/30/23 0215             Note:  This document was prepared using Dragon voice recognition software and may include  unintentional dictation errors.   Arlander Charleston, MD 07/30/23 [phone]

## 2023-07-30 NOTE — Discharge Instructions (Addendum)
 Your ultrasound was reassuring.  Your pain was improved with medications here.  I am prescribing you a stronger pain medication to take.  Remember it can cause constipation so I have prescribed you laxatives and stool softeners to prevent that.  Your sodium level was low today, this needs to be rechecked by your PCP within the next week

## 2023-07-30 NOTE — ED Notes (Signed)
 [place] at bedside

## 2023-10-16 ENCOUNTER — Ambulatory Visit
Admission: RE | Admit: 2023-10-16 | Discharge: 2023-10-16 | Disposition: A | Discharge status: Home / Self Care | Source: Ambulatory Visit | Attending: Family Medicine | Admitting: Family Medicine

## 2023-10-16 ENCOUNTER — Other Ambulatory Visit: Payer: Self-pay | Admitting: Family Medicine

## 2023-10-16 DIAGNOSIS — R112 Nausea with vomiting, unspecified: Secondary | ICD-10-CM

## 2023-10-16 DIAGNOSIS — R1013 Epigastric pain: Secondary | ICD-10-CM

## 2023-10-16 DIAGNOSIS — R1032 Left lower quadrant pain: Secondary | ICD-10-CM | POA: Insufficient documentation

## 2023-10-16 MED ORDER — IOHEXOL 300 MG/ML  SOLN
100.0000 mL | Freq: Once | INTRAMUSCULAR | Status: AC | PRN
  Administered 2023-10-16: 100 mL via INTRAVENOUS

## 2023-10-17 ENCOUNTER — Other Ambulatory Visit: Payer: Self-pay | Admitting: Family Medicine

## 2023-10-17 ENCOUNTER — Other Ambulatory Visit: Payer: Self-pay

## 2023-10-17 ENCOUNTER — Emergency Department

## 2023-10-17 ENCOUNTER — Emergency Department: Admission: EM | Admit: 2023-10-17 | Discharge: 2023-10-17 | Disposition: A | Discharge status: Home / Self Care

## 2023-10-17 DIAGNOSIS — R911 Solitary pulmonary nodule: Secondary | ICD-10-CM

## 2023-10-17 DIAGNOSIS — I10 Essential (primary) hypertension: Secondary | ICD-10-CM | POA: Insufficient documentation

## 2023-10-17 DIAGNOSIS — E871 Hypo-osmolality and hyponatremia: Secondary | ICD-10-CM | POA: Diagnosis not present

## 2023-10-17 DIAGNOSIS — R300 Dysuria: Secondary | ICD-10-CM | POA: Diagnosis present

## 2023-10-17 DIAGNOSIS — N12 Tubulo-interstitial nephritis, not specified as acute or chronic: Secondary | ICD-10-CM | POA: Insufficient documentation

## 2023-10-17 DIAGNOSIS — R2241 Localized swelling, mass and lump, right lower limb: Secondary | ICD-10-CM

## 2023-10-17 DIAGNOSIS — E039 Hypothyroidism, unspecified: Secondary | ICD-10-CM | POA: Diagnosis not present

## 2023-10-17 LAB — URINALYSIS, ROUTINE W REFLEX MICROSCOPIC
Bilirubin Urine: NEGATIVE
Glucose, UA: NEGATIVE mg/dL
Ketones, ur: NEGATIVE mg/dL
Leukocytes,Ua: NEGATIVE
Nitrite: NEGATIVE
Protein, ur: NEGATIVE mg/dL
Specific Gravity, Urine: 1.035 — ABNORMAL HIGH (ref 1.005–1.030)
pH: 5 (ref 5.0–8.0)

## 2023-10-17 LAB — LIPASE, BLOOD: Lipase: 34 U/L (ref 11–51)

## 2023-10-17 LAB — COMPREHENSIVE METABOLIC PANEL WITH GFR
ALT: 23 U/L (ref 0–44)
AST: 42 U/L — ABNORMAL HIGH (ref 15–41)
Albumin: 3.6 g/dL (ref 3.5–5.0)
Alkaline Phosphatase: 86 U/L (ref 38–126)
Anion gap: 13 (ref 5–15)
BUN: 24 mg/dL — ABNORMAL HIGH (ref 8–23)
CO2: 23 mmol/L (ref 22–32)
Calcium: 8.9 mg/dL (ref 8.9–10.3)
Chloride: 97 mmol/L — ABNORMAL LOW (ref 98–111)
Creatinine, Ser: 1.25 mg/dL — ABNORMAL HIGH (ref 0.44–1.00)
GFR, Estimated: 48 mL/min — ABNORMAL LOW (ref >60)
Glucose, Bld: 137 mg/dL — ABNORMAL HIGH (ref 70–99)
Potassium: 4.3 mmol/L (ref 3.5–5.1)
Sodium: 133 mmol/L — ABNORMAL LOW (ref 135–145)
Total Bilirubin: 1.1 mg/dL (ref 0.0–1.2)
Total Protein: 7.4 g/dL (ref 6.5–8.1)

## 2023-10-17 LAB — CBC
HCT: 37.4 % (ref 36.0–46.0)
Hemoglobin: 12 g/dL (ref 12.0–15.0)
MCH: 29 pg (ref 26.0–34.0)
MCHC: 32.1 g/dL (ref 30.0–36.0)
MCV: 90.3 fL (ref 80.0–100.0)
Platelets: 244 K/uL (ref 150–400)
RBC: 4.14 MIL/uL (ref 3.87–5.11)
RDW: 13.4 % (ref 11.5–15.5)
WBC: 8.7 K/uL (ref 4.0–10.5)
nRBC: 0 % (ref 0.0–0.2)

## 2023-10-17 MED ORDER — ONDANSETRON HCL 4 MG/2ML IJ SOLN
4.0000 mg | Freq: Once | INTRAMUSCULAR | Status: AC
  Administered 2023-10-17: 4 mg via INTRAVENOUS
  Filled 2023-10-17: qty 2

## 2023-10-17 MED ORDER — ONDANSETRON 4 MG PO TBDP: 4.0000 mg | ORAL_TABLET | Freq: Three times a day (TID) | ORAL | 0 refills | Status: DC | PRN

## 2023-10-17 MED ORDER — KETOROLAC TROMETHAMINE 15 MG/ML IJ SOLN
15.0000 mg | Freq: Once | INTRAMUSCULAR | Status: AC
  Administered 2023-10-17: 15 mg via INTRAVENOUS
  Filled 2023-10-17: qty 1

## 2023-10-17 MED ORDER — CEPHALEXIN 500 MG PO CAPS: 500.0000 mg | ORAL_CAPSULE | Freq: Four times a day (QID) | ORAL | 0 refills | Status: AC

## 2023-10-17 MED ORDER — CEFTRIAXONE SODIUM 1 G IJ SOLR
1.0000 g | Freq: Once | INTRAMUSCULAR | Status: AC
  Administered 2023-10-17: 1 g via INTRAVENOUS
  Filled 2023-10-17: qty 10

## 2023-10-17 MED ORDER — IOHEXOL 300 MG/ML  SOLN
80.0000 mL | Freq: Once | INTRAMUSCULAR | Status: AC | PRN
  Administered 2023-10-17: 80 mL via INTRAVENOUS

## 2023-10-17 MED ORDER — SODIUM CHLORIDE 0.9 % IV BOLUS
1000.0000 mL | Freq: Once | INTRAVENOUS | Status: AC
  Administered 2023-10-17: 1000 mL via INTRAVENOUS

## 2023-10-17 NOTE — ED Provider Notes (Signed)
 [facility] Emergency Department Provider Note     Event Date/Time   First MD Initiated Contact with Patient 10/17/23 1443     (approximate)   History   Abdominal Pain   HPI  Felicia Durham is a 63 y.o. female with a past medical history of HTN, anxiety, depression, hypothyroidism and GERD presents to the ED for evaluation of dysuria and urinary frequency x 1 week.  She was sent over by [facility] urgent care for evaluation of suspicion for pyelonephritis.  Associated symptoms includes generalized abdominal pain more prominent in the lower abdomen and vomiting.     Physical Exam   Triage Vital Signs: ED Triage Vitals  Encounter Vitals Group     BP 10/17/23 1316 107/68     Girls Systolic BP Percentile --      Girls Diastolic BP Percentile --      Boys Systolic BP Percentile --      Boys Diastolic BP Percentile --      Pulse Rate 10/17/23 1316 98     Resp 10/17/23 1316 18     Temp 10/17/23 1316 98.7 F (37.1 C)     Temp Source 10/17/23 1316 Oral     SpO2 10/17/23 1316 96 %     Weight --      Height --      Head Circumference --      Peak Flow --      Pain Score 10/17/23 1315 10     Pain Loc --      Pain Education --      Exclude from Growth Chart --     Most recent vital signs: Vitals:   10/17/23 1316  BP: 107/68  Pulse: 98  Resp: 18  Temp: 98.7 F (37.1 C)  SpO2: 96%   General: Well appearing and comfortable. Alert and oriented. INAD.  Skin:  Warm, dry and intact. No rashes or lesions noted.     Head:  NCAT.  CV:  Good peripheral perfusion.  RESP:  Normal effort.  ABD:  No distention. Soft, diffuse generalized tenderness to palpation.  No guarding.  No masses or organomegaly. No CVA tenderness bilaterally.     ED Results / Procedures / Treatments   Labs (all labs ordered are listed, but only abnormal results are displayed) Labs Reviewed  COMPREHENSIVE METABOLIC PANEL WITH GFR - Abnormal; Notable for the following  components:      Result Value   Sodium 133 (*)    Chloride 97 (*)    Glucose, Bld 137 (*)    BUN 24 (*)    Creatinine, Ser 1.25 (*)    AST 42 (*)    GFR, Estimated 48 (*)    All other components within normal limits  URINE CULTURE  CBC  URINALYSIS, ROUTINE W REFLEX MICROSCOPIC  LIPASE, BLOOD   RADIOLOGY  I personally viewed and evaluated these images as part of my medical decision making, as well as reviewing the written report by the radiologist.  CT ABDOMEN PELVIS W CONTRAST Result Date: 10/17/2023 CLINICAL DATA:  Acute abdominal pain, back pain, vomiting, pyelonephritis EXAM: CT ABDOMEN AND PELVIS WITH CONTRAST TECHNIQUE: Multidetector CT imaging of the abdomen and pelvis was performed using the standard protocol following bolus administration of intravenous contrast. RADIATION DOSE REDUCTION: This exam was performed according to the departmental dose-optimization program which includes automated exposure control, adjustment of the mA and/or kV according to patient size and/or use of iterative reconstruction technique.  CONTRAST:  80mL OMNIPAQUE  IOHEXOL  300 MG/ML  SOLN COMPARISON:  10/16/2023 FINDINGS: Lower chest: 15 x 11 x 15 mm subpleural right middle lobe pulmonary nodule with slightly irregular margins again identified. Recommend correlation with CT chest when clinically appropriate. Hepatobiliary: No focal liver abnormality is seen. No gallstones, gallbladder wall thickening, or biliary dilatation. Pancreas: Unremarkable. No pancreatic ductal dilatation or surrounding inflammatory changes. Spleen: Normal in size without focal abnormality. Adrenals/Urinary Tract: Heterogeneous decreased cortical enhancement within the ventral mid left kidney again identified, consistent with focal pyelonephritis. Stable abnormal enhancement of the left ureter consistent with urinary tract infection. Right kidney is unremarkable. No urinary tract calculi or obstruction. The bladder is only minimally  distended, limiting its evaluation. The adrenals are unremarkable. Stomach/Bowel: No bowel obstruction or ileus. Normal appendix right lower quadrant. Scattered colonic diverticulosis without diverticulitis. No bowel wall thickening or inflammatory change. Vascular/Lymphatic: Aortic atherosclerosis. No enlarged abdominal or pelvic lymph nodes. Reproductive: Status post hysterectomy. No adnexal masses. Other: No free fluid or free intraperitoneal gas. No abdominal wall hernia. 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 No acute or destructive bony abnormalities. Reconstructed images demonstrate no additional findings. IMPRESSION: 1. Stable findings of urinary tract infection and focal left pyelonephritis. No evidence of renal abscess at this time. 2. Stable 15 mm right middle lobe pulmonary nodule. Follow-up noncontrast chest CT again recommended. As before, if no other lesions are identified, consider one of the following in 3 months for both low-risk and high-risk individuals: (a) repeat chest CT, (b) follow-up PET-CT, or (c) tissue sampling. This recommendation follows the consensus statement: Guidelines for Management of Incidental Pulmonary Nodules Detected on CT Images: From the Fleischner Society 2017; Radiology 2017; 284:228-243. 3. Scattered colonic diverticulosis without diverticulitis. 4. 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 5.  Aortic Atherosclerosis (ICD10-I70.0). Electronically Signed   By: Ozell Daring M.D.   On: 10/17/2023 16:47    PROCEDURES:  Critical Care performed: No  Procedures   MEDICATIONS ORDERED IN ED: Medications  cefTRIAXone  (ROCEPHIN ) 1 g in sodium chloride  0.9 % 100 mL IVPB (1 g Intravenous New Bag/Given 10/17/23 1818)  sodium chloride  0.9 % bolus 1,000 mL (0 mLs Intravenous Stopped 10/17/23 1817)  ondansetron  (ZOFRAN ) injection 4 mg (4  mg Intravenous Given 10/17/23 1546)  ketorolac  (TORADOL ) 15 MG/ML injection 15 mg (15 mg Intravenous Given 10/17/23 1548)  iohexol  (OMNIPAQUE ) 300 MG/ML solution 80 mL (80 mLs Intravenous Contrast Given 10/17/23 1623)     IMPRESSION / MDM / ASSESSMENT AND PLAN / ED COURSE  I reviewed the triage vital signs and the nursing notes.                              Clinical Course as of 10/17/23 1835  [place] Oct 17, 2023  1814 On reassessment patient reports no pain at this time.  Awaiting urinalysis. [MH]    Clinical Course User Index [MH] Margrette Monte A, PA-C   63 y.o. female presents to the emergency department for evaluation and treatment of dysuria and generalized abdominal pain. See HPI for further details.   Differential diagnosis includes, but is not limited to UTI, pyelonephritis, gastroenteritis  Patient's presentation is most consistent with acute complicated illness / injury requiring diagnostic workup.  Patient is alert and oriented.  She is hemodynamic stable and afebrile.  Physical exam findings are stated above.  Plan will be to  obtain basic labs, lipase, UA, urine culture, CT abdomen pelvis.  CT abdomen pelvis results consistent with pyelonephritis.  Will administer Rocephin  IV and continue oral antibiotics outpatient.  I presume patient will be stable for discharge home and outpatient management.  Will await urinalysis results.  Urine culture pending.  Patient in stable condition for discharge home.  ED return precaution discussed.  FINAL CLINICAL IMPRESSION(S) / ED DIAGNOSES   Final diagnoses:  Pyelonephritis   Rx / DC Orders   ED Discharge Orders          Ordered    cephALEXin  (KEFLEX ) 500 MG capsule  4 times daily        10/17/23 1828    ondansetron  (ZOFRAN -ODT) 4 MG disintegrating tablet  Every 8 hours PRN        10/17/23 1829           Note:  This document was prepared using Dragon voice recognition software and may include unintentional dictation  errors.    Margrette, Gray Maugeri A, PA-C 10/17/23 1837    Clarine Ozell LABOR, MD 10/17/23 2008

## 2023-10-17 NOTE — Discharge Instructions (Addendum)
 Your evaluated in the ED.  Your workup is consistent with pyelonephritis which is an infection of the kidney on the left side.  You were given IV antibiotics and will need to continue oral antibiotics at home.  Antibiotics have been sent to your pharmacy.  Please take as instructed until dose is complete.    There was an incidental finding of a stable 15 mm right middle lobe pulmonary nodule.  He will need to follow-up with your primary care provider in 3 months for a noncontrast chest CT to monitor this lesion.  Follow-up with your primary care provider for further management.  If any new or worsening symptoms occur return to ED for further evaluation.  Pain control:  Ibuprofen (motrin/aleve/advil) - You can take 3 tablets (600 mg) every 6 hours as needed for pain/fever.  Acetaminophen  (tylenol ) - You can take 2 extra strength tablets (1000 mg) every 6 hours as needed for pain/fever.  You can alternate these medications or take them together.  Make sure you eat food/drink water when taking these medications.

## 2023-10-17 NOTE — ED Notes (Signed)
 Patient ambulated with a rolling gait to the hallway bathroom.

## 2023-10-17 NOTE — ED Triage Notes (Signed)
 Patient was sent over by [facility] for Pyelonephritis, patient reports lower abdominal and back pain and also vomiting. [facility] did not give IM Rocephin  due to patient vomiting.

## 2023-10-17 NOTE — ED Notes (Addendum)
 Patient tried to give urine sample earlier, but only had a few drops of amber urine.

## 2023-10-18 LAB — URINE CULTURE: Culture: NO GROWTH

## 2023-10-28 ENCOUNTER — Other Ambulatory Visit: Payer: Self-pay | Admitting: Family Medicine

## 2023-10-28 ENCOUNTER — Ambulatory Visit
Admission: RE | Admit: 2023-10-28 | Discharge: 2023-10-28 | Disposition: A | Discharge status: Home / Self Care | Source: Ambulatory Visit | Attending: Family Medicine | Admitting: Family Medicine

## 2023-10-28 DIAGNOSIS — R2241 Localized swelling, mass and lump, right lower limb: Secondary | ICD-10-CM

## 2023-10-28 DIAGNOSIS — R911 Solitary pulmonary nodule: Secondary | ICD-10-CM | POA: Diagnosis present

## 2023-11-17 ENCOUNTER — Other Ambulatory Visit: Payer: Self-pay | Admitting: Orthopedic Surgery

## 2023-11-26 ENCOUNTER — Other Ambulatory Visit: Payer: Self-pay | Admitting: Family Medicine

## 2023-11-26 DIAGNOSIS — Z1231 Encounter for screening mammogram for malignant neoplasm of breast: Secondary | ICD-10-CM

## 2023-11-28 NOTE — Patient Instructions (Addendum)
 Your procedure is scheduled on:   THURSDAY  NOVEMBER 13  Report to the Registration Desk on the 1st floor of the [facility] To find out your arrival time, please call [phone] between 1PM - 3PM on:    [facility]  NOVEMBER 12  If your arrival time is 6:00 am, do not arrive before that time as the [facility] entrance doors do not open until 6:00 am.  REMEMBER: Instructions that are not followed completely may result in serious medical risk, up to and including death; or upon the discretion of your surgeon and anesthesiologist your surgery may need to be rescheduled.  Do not eat food after midnight the night before surgery.  No gum chewing or hard candies.  You may however, drink CLEAR liquids up to 3 hours before you are scheduled to arrive for your surgery. Do not drink anything within 3 hours of your scheduled arrival time.  Clear liquids include: - water  - apple juice without pulp - gatorade (not RED colors) - black coffee or tea (Do NOT add milk or creamers to the coffee or tea) Do NOT drink anything that is not on this list.   In addition, your doctor has ordered for you to drink the provided:  Ensure Pre-Surgery Clear Carbohydrate Drink  Drinking this carbohydrate drink up to three hours before surgery helps to reduce insulin resistance and improve patient outcomes. Please complete drinking 3 hours before scheduled arrival time.  One week prior to surgery: Stop Anti-inflammatories (NSAIDS) such as Advil, Aleve, Ibuprofen, Motrin, Naproxen, Naprosyn and Aspirin based products such as Excedrin, Goody's Powder, BC Powder. Stop ANY OVER THE COUNTER supplements until after surgery.  You may however, continue to take Tylenol  if needed for pain up until the day of surgery.  Continue taking all of your other prescription medications up until the day of surgery.  ON THE DAY OF SURGERY ONLY TAKE THESE MEDICATIONS WITH SIPS OF WATER:  citalopram  (CELEXA )  levothyroxine   (SYNTHROID )   No Alcohol for 24 hours before or after surgery.  Do not use any recreational drugs for at least a week (preferably 2 weeks) before your surgery.  Please be advised that the combination of cocaine and anesthesia may have negative outcomes, up to and including death. If you test positive for cocaine, your surgery will be cancelled.  On the morning of surgery brush your teeth with toothpaste and water, you may rinse your mouth with mouthwash if you wish. Do not swallow any toothpaste or mouthwash.  Use CHG Soap as directed on instruction sheet.  Do not wear jewelry, make-up, hairpins, clips or nail polish.  For welded (permanent) jewelry: bracelets, anklets, waist bands, etc.  Please have this removed prior to surgery.  If it is not removed, there is a chance that hospital personnel will need to cut it off on the day of surgery.  Do not wear lotions, powders, or perfumes.   Do not shave body hair from the neck down 48 hours before surgery.  Do not bring valuables to the hospital. [facility] is not responsible for any missing/lost belongings or valuables.   Notify your doctor if there is any change in your medical condition (cold, fever, infection).  Wear comfortable clothing (specific to your surgery type) to the hospital.  After surgery, you can help prevent lung complications by doing breathing exercises.  Take deep breaths and cough every 1-2 hours. Your doctor may order a device called an Incentive Spirometer to help you take  deep breaths.  If you are being admitted to the hospital overnight, leave your suitcase in the car. After surgery it may be brought to your room.  In case of increased patient census, it may be necessary for you, the patient, to continue your postoperative care in the Same Day Surgery department.  If you are being discharged the day of surgery, you will not be allowed to drive home. You will need a responsible individual to drive you home  and stay with you for 24 hours after surgery.   If you are taking public transportation, you will need to have a responsible individual with you.  Please call the Pre-admissions Testing Dept. at [phone] if you have any questions about these instructions.  Surgery Visitation Policy:  Patients having surgery or a procedure may have two visitors.  Children under the age of 1 must have an adult with them who is not the patient.  Inpatient Visitation:    Visiting hours are 7 a.m. to 8 p.m. Up to four visitors are allowed at one time in a patient room. The visitors may rotate out with other people during the day.  One visitor age 65 or older may stay with the patient overnight and must be in the room by 8 p.m.   Merchandiser, Retail to address health-related social needs:  [email or web address]      Pre-operative 4 CHG Bath Instructions   You can play a key role in reducing the risk of infection after surgery. Your skin needs to be as free of germs as possible. You can reduce the number of germs on your skin by washing with CHG (chlorhexidine  gluconate) soap before surgery. CHG is an antiseptic soap that kills germs and continues to kill germs even after washing.   DO NOT use if you have an allergy to chlorhexidine /CHG or antibacterial soaps. If your skin becomes reddened or irritated, stop using the CHG and notify one of our RNs at [phone].   Please shower with the CHG soap starting 4 days before surgery using the following schedule:     Please keep in mind the following:  DO NOT shave, including legs and underarms, starting the day of your first shower.   You may shave your face at any point before/day of surgery.  Place clean sheets on your bed the day you start using CHG soap. Use a clean washcloth (not used since being washed) for each shower. DO NOT sleep with pets once you start using the CHG.   CHG Shower Instructions:  If you choose to wash  your hair and private area, wash first with your normal shampoo/soap.  After you use shampoo/soap, rinse your hair and body thoroughly to remove shampoo/soap residue.  Turn the water OFF and apply about 3 tablespoons (45 ml) of CHG soap to a CLEAN washcloth.  Apply CHG soap ONLY FROM YOUR NECK DOWN TO YOUR TOES (washing for 3-5 minutes)  DO NOT use CHG soap on face, private areas, open wounds, or sores.  Pay special attention to the area where your surgery is being performed.  If you are having back surgery, having someone wash your back for you may be helpful. Wait 2 minutes after CHG soap is applied, then you may rinse off the CHG soap.  Pat dry with a clean towel  Put on clean clothes/pajamas   If you choose to wear lotion, please use ONLY the CHG-compatible lotions on the back of this paper.     Additional  instructions for the day of surgery: DO NOT APPLY any lotions, deodorants, cologne, or perfumes.   Put on clean/comfortable clothes.  Brush your teeth.  Ask your nurse before applying any prescription medications to the skin.      CHG Compatible Lotions   Aveeno Moisturizing lotion  Cetaphil Moisturizing Cream  Cetaphil Moisturizing Lotion  Clairol Herbal Essence Moisturizing Lotion, Dry Skin  Clairol Herbal Essence Moisturizing Lotion, Extra Dry Skin  Clairol Herbal Essence Moisturizing Lotion, Normal Skin  Curel Age Defying Therapeutic Moisturizing Lotion with Alpha Hydroxy  Curel Extreme Care Body Lotion  Curel Soothing Hands Moisturizing Hand Lotion  Curel Therapeutic Moisturizing Cream, Fragrance-Free  Curel Therapeutic Moisturizing Lotion, Fragrance-Free  Curel Therapeutic Moisturizing Lotion, Original Formula  Eucerin Daily Replenishing Lotion  Eucerin Dry Skin Therapy Plus Alpha Hydroxy Crme  Eucerin Dry Skin Therapy Plus Alpha Hydroxy Lotion  Eucerin Original Crme  Eucerin Original Lotion  Eucerin Plus Crme Eucerin Plus Lotion  Eucerin TriLipid Replenishing  Lotion  Keri Anti-Bacterial Hand Lotion  Keri Deep Conditioning Original Lotion Dry Skin Formula Softly Scented  Keri Deep Conditioning Original Lotion, Fragrance Free Sensitive Skin Formula  Keri Lotion Fast Absorbing Fragrance Free Sensitive Skin Formula  Keri Lotion Fast Absorbing Softly Scented Dry Skin Formula  Keri Original Lotion  Keri Skin Renewal Lotion Keri Silky Smooth Lotion  Keri Silky Smooth Sensitive Skin Lotion  Nivea Body Creamy Conditioning Oil  Nivea Body Extra Enriched Lotion  Nivea Body Original Lotion  Nivea Body Sheer Moisturizing Lotion Nivea Crme  Nivea Skin Firming Lotion  NutraDerm 30 Skin Lotion  NutraDerm Skin Lotion  NutraDerm Therapeutic Skin Cream  NutraDerm Therapeutic Skin Lotion  ProShield Protective Hand Cream  Provon moisturizing lotion      How to Use an Incentive Spirometer  An incentive spirometer is a tool that measures how well you are filling your lungs with each breath. Learning to take long, deep breaths using this tool can help you keep your lungs clear and active. This may help to reverse or lessen your chance of developing breathing (pulmonary) problems, especially infection. You may be asked to use a spirometer: After a surgery. If you have a lung problem or a history of smoking. After a long period of time when you have been unable to move or be active. If the spirometer includes an indicator to show the highest number that you have reached, your health care provider or respiratory therapist will help you set a goal. Keep a log of your progress as told by your health care provider. What are the risks? Breathing too quickly may cause dizziness or cause you to pass out. Take your time so you do not get dizzy or light-headed. If you are in pain, you may need to take pain medicine before doing incentive spirometry. It is harder to take a deep breath if you are having pain. How to use your incentive spirometer  Sit up on the edge of  your bed or on a chair. Hold the incentive spirometer so that it is in an upright position. Before you use the spirometer, breathe out normally. Place the mouthpiece in your mouth. Make sure your lips are closed tightly around it. Breathe in slowly and as deeply as you can through your mouth, causing the piston or the ball to rise toward the top of the chamber. Hold your breath for 3-5 seconds, or for as long as possible. If the spirometer includes a coach indicator, use this to guide you in breathing.  Slow down your breathing if the indicator goes above the marked areas. Remove the mouthpiece from your mouth and breathe out normally. The piston or ball will return to the bottom of the chamber. Rest for a few seconds, then repeat the steps 10 or more times. Take your time and take a few normal breaths between deep breaths so that you do not get dizzy or light-headed. Do this every 1-2 hours when you are awake. If the spirometer includes a goal marker to show the highest number you have reached (best effort), use this as a goal to work toward during each repetition. After each set of 10 deep breaths, cough a few times. This will help to make sure that your lungs are clear. If you have an incision on your chest or abdomen from surgery, place a pillow or a rolled-up towel firmly against the incision when you cough. This can help to reduce pain while taking deep breaths and coughing. General tips When you are able to get out of bed: Walk around often. Continue to take deep breaths and cough in order to clear your lungs. Keep using the incentive spirometer until your health care provider says it is okay to stop using it. If you have been in the hospital, you may be told to keep using the spirometer at home. Contact a health care provider if: You are having difficulty using the spirometer. You have trouble using the spirometer as often as instructed. Your pain medicine is not giving enough relief for  you to use the spirometer as told. You have a fever. Get help right away if: You develop shortness of breath. You develop a cough with bloody mucus from the lungs. You have fluid or blood coming from an incision site after you cough. Summary An incentive spirometer is a tool that can help you learn to take long, deep breaths to keep your lungs clear and active. You may be asked to use a spirometer after a surgery, if you have a lung problem or a history of smoking, or if you have been inactive for a long period of time. Use your incentive spirometer as instructed every 1-2 hours while you are awake. If you have an incision on your chest or abdomen, place a pillow or a rolled-up towel firmly against your incision when you cough. This will help to reduce pain. Get help right away if you have shortness of breath, you cough up bloody mucus, or blood comes from your incision when you cough. This information is not intended to replace advice given to you by your health care provider. Make sure you discuss any questions you have with your health care provider. Document Revised: 04/05/2019 Document Reviewed: 04/05/2019 Elsevier Patient Education  2023 Elsevier Inc.           Preoperative Educational Videos for Total Hip, Knee and Shoulder Replacements  To better prepare for surgery, please view our videos that explain the physical activity and discharge planning required to have the best surgical recovery at [facility].  [email or web address]  Questions? Call [phone] or email [email or web address]

## 2023-12-01 ENCOUNTER — Other Ambulatory Visit: Payer: Self-pay

## 2023-12-01 ENCOUNTER — Encounter
Admission: RE | Admit: 2023-12-01 | Discharge: 2023-12-01 | Disposition: A | Discharge status: Home / Self Care | Source: Ambulatory Visit | Attending: Orthopedic Surgery | Admitting: Orthopedic Surgery

## 2023-12-01 VITALS — BP 132/97 | HR 83 | Temp 98.0°F | Resp 18 | Ht 63.0 in | Wt 217.0 lb

## 2023-12-01 DIAGNOSIS — Z01818 Encounter for other preprocedural examination: Secondary | ICD-10-CM | POA: Insufficient documentation

## 2023-12-01 DIAGNOSIS — Z01812 Encounter for preprocedural laboratory examination: Secondary | ICD-10-CM

## 2023-12-01 HISTORY — DX: Prediabetes: R73.03

## 2023-12-01 LAB — SURGICAL PCR SCREEN
MRSA, PCR: NEGATIVE
Staphylococcus aureus: NEGATIVE

## 2023-12-09 NOTE — Anesthesia Preprocedure Evaluation (Signed)
 Anesthesia Evaluation  Patient identified by MRN, date of birth, ID band Patient awake    Reviewed: Allergy & Precautions, H&P , NPO status , Patient's Chart, lab work & pertinent test results, reviewed documented beta blocker date and time   History of Anesthesia Complications Negative for: history of anesthetic complications  Airway Mallampati: II  TM Distance: >3 FB Neck ROM: full    Dental  (+) Missing,    Pulmonary former smoker   Pulmonary exam normal breath sounds clear to auscultation       Cardiovascular Exercise Tolerance: Good hypertension, (-) angina (-) Past MI and (-) Cardiac Stents Normal cardiovascular exam(-) dysrhythmias (-) Valvular Problems/Murmurs Rhythm:regular Rate:Normal     Neuro/Psych  PSYCHIATRIC DISORDERS Anxiety Depression    negative neurological ROS     GI/Hepatic negative GI ROS, Neg liver ROS,,,  Endo/Other  neg diabetesHypothyroidism    Renal/GU negative Renal ROS  negative genitourinary   Musculoskeletal  (+) Arthritis ,    Abdominal  (+) + obese  Peds  Hematology negative hematology ROS (+)   Anesthesia Other Findings Past Medical History: No date: Anxiety No date: Depression No date: GERD (gastroesophageal reflux disease) No date: Hypertension No date: Hypothyroidism   Reproductive/Obstetrics negative OB ROS                              Anesthesia Physical Anesthesia Plan  ASA: 2  Anesthesia Plan: Spinal   Post-op Pain Management: Regional block* and Ofirmev  IV (intra-op)*   Induction: Intravenous  PONV Risk Score and Plan: 2 and Propofol  infusion, TIVA and Treatment may vary due to age or medical condition  Airway Management Planned: Natural Airway and Simple Face Mask  Additional Equipment:   Intra-op Plan:   Post-operative Plan:   Informed Consent: I have reviewed the patients History and Physical, chart, labs and discussed the  procedure including the risks, benefits and alternatives for the proposed anesthesia with the patient or authorized representative who has indicated his/her understanding and acceptance.     Dental advisory given  Plan Discussed with: CRNA and Anesthesiologist  Anesthesia Plan Comments:          Anesthesia Quick Evaluation

## 2023-12-11 ENCOUNTER — Ambulatory Visit: Payer: Self-pay | Admitting: Urgent Care

## 2023-12-11 ENCOUNTER — Ambulatory Visit: Admitting: Anesthesiology

## 2023-12-11 ENCOUNTER — Ambulatory Visit
Admission: RE | Admit: 2023-12-11 | Discharge: 2023-12-12 | Disposition: A | Discharge status: Home Health | Attending: Orthopedic Surgery | Admitting: Orthopedic Surgery

## 2023-12-11 ENCOUNTER — Ambulatory Visit

## 2023-12-11 ENCOUNTER — Encounter: Payer: Self-pay | Admitting: Orthopedic Surgery

## 2023-12-11 ENCOUNTER — Encounter: Admission: RE | Disposition: A | Payer: Self-pay | Source: Home / Self Care | Attending: Orthopedic Surgery

## 2023-12-11 ENCOUNTER — Other Ambulatory Visit: Payer: Self-pay

## 2023-12-11 DIAGNOSIS — Z87891 Personal history of nicotine dependence: Secondary | ICD-10-CM | POA: Insufficient documentation

## 2023-12-11 DIAGNOSIS — F32A Depression, unspecified: Secondary | ICD-10-CM | POA: Diagnosis not present

## 2023-12-11 DIAGNOSIS — E039 Hypothyroidism, unspecified: Secondary | ICD-10-CM | POA: Insufficient documentation

## 2023-12-11 DIAGNOSIS — I10 Essential (primary) hypertension: Secondary | ICD-10-CM | POA: Diagnosis not present

## 2023-12-11 DIAGNOSIS — Z96651 Presence of right artificial knee joint: Secondary | ICD-10-CM

## 2023-12-11 DIAGNOSIS — M1711 Unilateral primary osteoarthritis, right knee: Secondary | ICD-10-CM | POA: Diagnosis present

## 2023-12-11 HISTORY — PX: TOTAL KNEE ARTHROPLASTY: SHX125

## 2023-12-11 SURGERY — ARTHROPLASTY, KNEE, TOTAL
Anesthesia: Spinal | Site: Knee | Laterality: Right

## 2023-12-11 MED ORDER — PANTOPRAZOLE SODIUM 40 MG PO TBEC
40.0000 mg | DELAYED_RELEASE_TABLET | Freq: Every day | ORAL | Status: DC
  Administered 2023-12-11 – 2023-12-12 (×2): 40 mg via ORAL
  Filled 2023-12-11 (×2): qty 1

## 2023-12-11 MED ORDER — GLYCOPYRROLATE 0.2 MG/ML IJ SOLN
INTRAMUSCULAR | Status: AC
  Filled 2023-12-11: qty 1

## 2023-12-11 MED ORDER — PHENYLEPHRINE 80 MCG/ML (10ML) SYRINGE FOR IV PUSH (FOR BLOOD PRESSURE SUPPORT)
PREFILLED_SYRINGE | INTRAVENOUS | Status: DC | PRN
Start: 2023-12-11 — End: 2023-12-11
  Administered 2023-12-11 (×2): 80 ug via INTRAVENOUS
  Administered 2023-12-11: 160 ug via INTRAVENOUS
  Administered 2023-12-11: 80 ug via INTRAVENOUS
  Administered 2023-12-11: 160 ug via INTRAVENOUS

## 2023-12-11 MED ORDER — CHLORHEXIDINE GLUCONATE 0.12 % MT SOLN
OROMUCOSAL | Status: AC
  Filled 2023-12-11: qty 15

## 2023-12-11 MED ORDER — CEFAZOLIN SODIUM-DEXTROSE 2-4 GM/100ML-% IV SOLN
INTRAVENOUS | Status: AC
  Filled 2023-12-11: qty 100

## 2023-12-11 MED ORDER — ACETAMINOPHEN 500 MG PO TABS: 1000.0000 mg | ORAL_TABLET | Freq: Three times a day (TID) | ORAL | Status: DC

## 2023-12-11 MED ORDER — PROPOFOL 1000 MG/100ML IV EMUL
INTRAVENOUS | Status: AC
  Filled 2023-12-11: qty 100

## 2023-12-11 MED ORDER — ONDANSETRON HCL 4 MG/2ML IJ SOLN
INTRAMUSCULAR | Status: AC
  Filled 2023-12-11: qty 2

## 2023-12-11 MED ORDER — SODIUM CHLORIDE (PF) 0.9 % IJ SOLN
INTRAMUSCULAR | Status: AC
  Filled 2023-12-11: qty 60

## 2023-12-11 MED ORDER — BUPIVACAINE HCL (PF) 0.5 % IJ SOLN
INTRAMUSCULAR | Status: AC
  Filled 2023-12-11: qty 10

## 2023-12-11 MED ORDER — KETOROLAC TROMETHAMINE 30 MG/ML IJ SOLN
INTRAMUSCULAR | Status: DC | PRN
  Administered 2023-12-11: 71 mL via INTRAMUSCULAR

## 2023-12-11 MED ORDER — ACETAMINOPHEN 10 MG/ML IV SOLN: 1000.0000 mg | Freq: Once | INTRAVENOUS | Status: DC | PRN

## 2023-12-11 MED ORDER — MORPHINE SULFATE (PF) 2 MG/ML IV SOLN: 0.5000 mg | INTRAVENOUS | Status: DC | PRN

## 2023-12-11 MED ORDER — IRBESARTAN 150 MG PO TABS
150.0000 mg | ORAL_TABLET | Freq: Every day | ORAL | Status: DC
  Administered 2023-12-11 – 2023-12-12 (×2): 150 mg via ORAL
  Filled 2023-12-11 (×2): qty 1

## 2023-12-11 MED ORDER — ONDANSETRON HCL 4 MG PO TABS
4.0000 mg | ORAL_TABLET | Freq: Four times a day (QID) | ORAL | 0 refills | Status: AC | PRN
  Filled 2023-12-11: qty 20, 5d supply, fill #0

## 2023-12-11 MED ORDER — MIDAZOLAM HCL 2 MG/2ML IJ SOLN
INTRAMUSCULAR | Status: AC
  Filled 2023-12-11: qty 2

## 2023-12-11 MED ORDER — TRANEXAMIC ACID-NACL 1000-0.7 MG/100ML-% IV SOLN
INTRAVENOUS | Status: AC
  Filled 2023-12-11: qty 100

## 2023-12-11 MED ORDER — ONDANSETRON HCL 4 MG/2ML IJ SOLN
INTRAMUSCULAR | Status: DC | PRN
  Administered 2023-12-11: 4 mg via INTRAVENOUS

## 2023-12-11 MED ORDER — PROPOFOL 10 MG/ML IV BOLUS
INTRAVENOUS | Status: AC
  Filled 2023-12-11: qty 20

## 2023-12-11 MED ORDER — ONDANSETRON HCL 4 MG/2ML IJ SOLN: 4.0000 mg | Freq: Four times a day (QID) | INTRAMUSCULAR | Status: DC | PRN

## 2023-12-11 MED ORDER — CHLORHEXIDINE GLUCONATE 0.12 % MT SOLN
15.0000 mL | Freq: Once | OROMUCOSAL | Status: AC
  Administered 2023-12-11: 15 mL via OROMUCOSAL

## 2023-12-11 MED ORDER — DOCUSATE SODIUM 100 MG PO CAPS
100.0000 mg | ORAL_CAPSULE | Freq: Two times a day (BID) | ORAL | 0 refills | Status: AC
  Filled 2023-12-11: qty 30, 15d supply, fill #0

## 2023-12-11 MED ORDER — ENOXAPARIN SODIUM 30 MG/0.3ML IJ SOSY
30.0000 mg | PREFILLED_SYRINGE | Freq: Two times a day (BID) | INTRAMUSCULAR | Status: DC
  Administered 2023-12-12: 30 mg via SUBCUTANEOUS
  Filled 2023-12-11: qty 0.3

## 2023-12-11 MED ORDER — TRAMADOL HCL 50 MG PO TABS
50.0000 mg | ORAL_TABLET | Freq: Four times a day (QID) | ORAL | 0 refills | Status: AC | PRN
  Filled 2023-12-11: qty 30, 8d supply, fill #0

## 2023-12-11 MED ORDER — CEFAZOLIN SODIUM-DEXTROSE 2-4 GM/100ML-% IV SOLN
2.0000 g | Freq: Four times a day (QID) | INTRAVENOUS | Status: AC
  Administered 2023-12-11 (×2): 2 g via INTRAVENOUS
  Filled 2023-12-11 (×2): qty 100

## 2023-12-11 MED ORDER — OXYCODONE HCL 5 MG PO TABS: 5.0000 mg | ORAL_TABLET | Freq: Once | ORAL | Status: DC | PRN

## 2023-12-11 MED ORDER — FENTANYL CITRATE (PF) 100 MCG/2ML IJ SOLN
INTRAMUSCULAR | Status: AC
  Filled 2023-12-11: qty 2

## 2023-12-11 MED ORDER — DEXAMETHASONE SOD PHOSPHATE PF 10 MG/ML IJ SOLN
8.0000 mg | Freq: Once | INTRAMUSCULAR | Status: AC
  Administered 2023-12-11: 10 mg via INTRAVENOUS

## 2023-12-11 MED ORDER — HYDROCHLOROTHIAZIDE 12.5 MG PO TABS
12.5000 mg | ORAL_TABLET | Freq: Every day | ORAL | Status: DC
  Administered 2023-12-11 – 2023-12-12 (×2): 12.5 mg via ORAL
  Filled 2023-12-11 (×2): qty 1

## 2023-12-11 MED ORDER — DROPERIDOL 2.5 MG/ML IJ SOLN: 0.6250 mg | Freq: Once | INTRAMUSCULAR | Status: DC | PRN

## 2023-12-11 MED ORDER — OXYCODONE HCL 5 MG PO TABS
2.5000 mg | ORAL_TABLET | Freq: Four times a day (QID) | ORAL | 0 refills | Status: AC | PRN
  Filled 2023-12-11: qty 30, 8d supply, fill #0

## 2023-12-11 MED ORDER — DOCUSATE SODIUM 100 MG PO CAPS
100.0000 mg | ORAL_CAPSULE | Freq: Two times a day (BID) | ORAL | Status: DC
  Administered 2023-12-11 – 2023-12-12 (×3): 100 mg via ORAL
  Filled 2023-12-11 (×3): qty 1

## 2023-12-11 MED ORDER — LACTATED RINGERS IV SOLN: INTRAVENOUS | Status: DC

## 2023-12-11 MED ORDER — SODIUM CHLORIDE 0.9 % IR SOLN
Status: DC | PRN
  Administered 2023-12-11: 3000 mL

## 2023-12-11 MED ORDER — TRAMADOL HCL 50 MG PO TABS: 50.0000 mg | ORAL_TABLET | Freq: Four times a day (QID) | ORAL | Status: DC | PRN

## 2023-12-11 MED ORDER — HYDROCODONE-ACETAMINOPHEN 5-325 MG PO TABS
1.0000 | ORAL_TABLET | ORAL | Status: DC | PRN
  Administered 2023-12-11 – 2023-12-12 (×3): 1 via ORAL
  Filled 2023-12-11 (×3): qty 1

## 2023-12-11 MED ORDER — METOCLOPRAMIDE HCL 10 MG PO TABS: 5.0000 mg | ORAL_TABLET | Freq: Three times a day (TID) | ORAL | Status: DC | PRN

## 2023-12-11 MED ORDER — FENTANYL CITRATE (PF) 100 MCG/2ML IJ SOLN: 25.0000 ug | INTRAMUSCULAR | Status: DC | PRN

## 2023-12-11 MED ORDER — BUPIVACAINE HCL (PF) 0.5 % IJ SOLN
INTRAMUSCULAR | Status: DC | PRN
Start: 2023-12-11 — End: 2023-12-11

## 2023-12-11 MED ORDER — CELECOXIB 200 MG PO CAPS
200.0000 mg | ORAL_CAPSULE | Freq: Two times a day (BID) | ORAL | 0 refills | Status: AC
  Filled 2023-12-11: qty 28, 14d supply, fill #0

## 2023-12-11 MED ORDER — PHENYLEPHRINE 80 MCG/ML (10ML) SYRINGE FOR IV PUSH (FOR BLOOD PRESSURE SUPPORT)
PREFILLED_SYRINGE | INTRAVENOUS | Status: AC
  Filled 2023-12-11: qty 10

## 2023-12-11 MED ORDER — SODIUM CHLORIDE 0.9 % IV SOLN: INTRAVENOUS | Status: DC

## 2023-12-11 MED ORDER — ACETAMINOPHEN 10 MG/ML IV SOLN
INTRAVENOUS | Status: AC
  Filled 2023-12-11: qty 100

## 2023-12-11 MED ORDER — ORAL CARE MOUTH RINSE: 15.0000 mL | Freq: Once | OROMUCOSAL | Status: AC

## 2023-12-11 MED ORDER — FENTANYL CITRATE (PF) 100 MCG/2ML IJ SOLN
INTRAMUSCULAR | Status: DC | PRN
  Administered 2023-12-11: 50 ug via INTRAVENOUS

## 2023-12-11 MED ORDER — BUPIVACAINE HCL (PF) 0.5 % IJ SOLN: INTRAMUSCULAR | Status: DC | PRN

## 2023-12-11 MED ORDER — ACETAMINOPHEN 10 MG/ML IV SOLN
INTRAVENOUS | Status: DC | PRN
  Administered 2023-12-11: 1000 mg via INTRAVENOUS

## 2023-12-11 MED ORDER — TRANEXAMIC ACID-NACL 1000-0.7 MG/100ML-% IV SOLN
1000.0000 mg | INTRAVENOUS | Status: AC
  Administered 2023-12-11 (×2): 1000 mg via INTRAVENOUS

## 2023-12-11 MED ORDER — ACETAMINOPHEN 500 MG PO TABS
1000.0000 mg | ORAL_TABLET | Freq: Three times a day (TID) | ORAL | 0 refills | Status: AC
  Filled 2023-12-11: qty 30, 5d supply, fill #0

## 2023-12-11 MED ORDER — KETOROLAC TROMETHAMINE 15 MG/ML IJ SOLN
7.5000 mg | Freq: Four times a day (QID) | INTRAMUSCULAR | Status: DC
  Administered 2023-12-11 – 2023-12-12 (×3): 7.5 mg via INTRAVENOUS
  Filled 2023-12-11 (×3): qty 1

## 2023-12-11 MED ORDER — GLYCOPYRROLATE 0.2 MG/ML IJ SOLN
INTRAMUSCULAR | Status: DC | PRN
  Administered 2023-12-11: .2 mg via INTRAVENOUS

## 2023-12-11 MED ORDER — SURGIPHOR WOUND IRRIGATION SYSTEM - OPTIME: TOPICAL | Status: DC | PRN

## 2023-12-11 MED ORDER — OLMESARTAN MEDOXOMIL-HCTZ 20-12.5 MG PO TABS: 1.0000 | ORAL_TABLET | Freq: Every day | ORAL | Status: DC

## 2023-12-11 MED ORDER — LEVOTHYROXINE SODIUM 25 MCG PO TABS
100.0000 ug | ORAL_TABLET | Freq: Every day | ORAL | Status: DC
  Administered 2023-12-12: 100 ug via ORAL
  Filled 2023-12-11: qty 4

## 2023-12-11 MED ORDER — CITALOPRAM HYDROBROMIDE 20 MG PO TABS
20.0000 mg | ORAL_TABLET | Freq: Every day | ORAL | Status: DC
  Administered 2023-12-11 – 2023-12-12 (×2): 20 mg via ORAL
  Filled 2023-12-11 (×2): qty 1

## 2023-12-11 MED ORDER — BUPIVACAINE HCL (PF) 0.5 % IJ SOLN
INTRAMUSCULAR | Status: DC | PRN
  Administered 2023-12-11: 2.5 mL via INTRATHECAL

## 2023-12-11 MED ORDER — MIDAZOLAM HCL 5 MG/5ML IJ SOLN
INTRAMUSCULAR | Status: DC | PRN
  Administered 2023-12-11: 2 mg via INTRAVENOUS

## 2023-12-11 MED ORDER — ACETAMINOPHEN 325 MG PO TABS: 325.0000 mg | ORAL_TABLET | Freq: Four times a day (QID) | ORAL | Status: DC | PRN

## 2023-12-11 MED ORDER — KETOROLAC TROMETHAMINE 30 MG/ML IJ SOLN
INTRAMUSCULAR | Status: AC
  Filled 2023-12-11: qty 1

## 2023-12-11 MED ORDER — BUPIVACAINE LIPOSOME 1.3 % IJ SUSP
INTRAMUSCULAR | Status: AC
Start: 2023-12-11 — End: 2023-12-11
  Filled 2023-12-11: qty 60

## 2023-12-11 MED ORDER — MENTHOL 3 MG MT LOZG: 1.0000 | LOZENGE | OROMUCOSAL | Status: DC | PRN

## 2023-12-11 MED ORDER — ONDANSETRON HCL 4 MG PO TABS: 4.0000 mg | ORAL_TABLET | Freq: Four times a day (QID) | ORAL | Status: DC | PRN

## 2023-12-11 MED ORDER — BUPIVACAINE-EPINEPHRINE (PF) 0.25% -1:200000 IJ SOLN
INTRAMUSCULAR | Status: AC
  Filled 2023-12-11: qty 90

## 2023-12-11 MED ORDER — ENOXAPARIN SODIUM 40 MG/0.4ML IJ SOSY
40.0000 mg | PREFILLED_SYRINGE | INTRAMUSCULAR | 0 refills | Status: AC
  Filled 2023-12-11: qty 5.6, 14d supply, fill #0

## 2023-12-11 MED ORDER — PROPOFOL 500 MG/50ML IV EMUL
INTRAVENOUS | Status: DC | PRN
  Administered 2023-12-11: 100 ug/kg/min via INTRAVENOUS

## 2023-12-11 MED ORDER — CEFAZOLIN SODIUM-DEXTROSE 2-4 GM/100ML-% IV SOLN
2.0000 g | INTRAVENOUS | Status: AC
  Administered 2023-12-11: 2 g via INTRAVENOUS

## 2023-12-11 MED ORDER — OXYCODONE HCL 5 MG/5ML PO SOLN: 5.0000 mg | Freq: Once | ORAL | Status: DC | PRN

## 2023-12-11 MED ORDER — PHENOL 1.4 % MT LIQD
1.0000 | OROMUCOSAL | Status: DC | PRN
  Administered 2023-12-11: 1 via OROMUCOSAL
  Filled 2023-12-11: qty 177

## 2023-12-11 MED ORDER — METOCLOPRAMIDE HCL 5 MG/ML IJ SOLN: 5.0000 mg | Freq: Three times a day (TID) | INTRAMUSCULAR | Status: DC | PRN

## 2023-12-11 SURGICAL SUPPLY — 57 items
BLADE PATELLA REAM PILOT HOLE (MISCELLANEOUS) IMPLANT
BLADE SAW 90X13X1.19 OSCILLAT (BLADE) IMPLANT
BLADE SAW SAG 25X90X1.19 (BLADE) ×1 IMPLANT
BLADE SAW SAG 29X58X.64 (BLADE) ×1 IMPLANT
BNDG ELASTIC 6INX 5YD STR LF (GAUZE/BANDAGES/DRESSINGS) ×1 IMPLANT
BOWL CEMENT MIX W/ADAPTER (MISCELLANEOUS) IMPLANT
BRUSH SCRUB EZ PLAIN DRY (MISCELLANEOUS) IMPLANT
CHLORAPREP W/TINT 26 (MISCELLANEOUS) ×2 IMPLANT
COMPONENT FEM STD PS KNEE 7 RT (Joint) IMPLANT
COMPONENT PATELLA 3 PEG 35 (Joint) IMPLANT
COMPONET TIB PS KNEE 0D E RT (Joint) IMPLANT
COOLER ICEMAN CLASSIC (MISCELLANEOUS) ×1 IMPLANT
CUFF TRNQT CYL 24X4X16.5-23 (TOURNIQUET CUFF) IMPLANT
CUFF TRNQT CYL 30X4X21-28X (TOURNIQUET CUFF) IMPLANT
DERMABOND ADVANCED .7 DNX12 (GAUZE/BANDAGES/DRESSINGS) ×1 IMPLANT
DRAPE SHEET LG 3/4 BI-LAMINATE (DRAPES) ×2 IMPLANT
DRSG MEPILEX SACRM 8.7X9.8 (GAUZE/BANDAGES/DRESSINGS) ×1 IMPLANT
DRSG OPSITE POSTOP 4X10 (GAUZE/BANDAGES/DRESSINGS) IMPLANT
DRSG OPSITE POSTOP 4X8 (GAUZE/BANDAGES/DRESSINGS) IMPLANT
ELECTRODE REM PT RTRN 9FT ADLT (ELECTROSURGICAL) ×1 IMPLANT
GLOVE BIO SURGEON STRL SZ8 (GLOVE) ×1 IMPLANT
GLOVE BIOGEL PI IND STRL 8 (GLOVE) ×1 IMPLANT
GLOVE PI ORTHO PRO STRL 7.5 (GLOVE) ×2 IMPLANT
GLOVE PI ORTHO PRO STRL SZ8 (GLOVE) ×2 IMPLANT
GLOVE SURG SYN 7.5 PF PI (GLOVE) ×1 IMPLANT
GOWN SRG XL LONG LVL 3 NONREIN (GOWNS) ×1 IMPLANT
GOWN SRG XL LVL 3 NONREINFORCE (GOWNS) ×1 IMPLANT
GOWN STRL REUS W/ TWL LRG LVL3 (GOWN DISPOSABLE) ×1 IMPLANT
HOOD PEEL AWAY T7 (MISCELLANEOUS) ×2 IMPLANT
INSERT TIB ARTISURF SZ6-7 (Insert) IMPLANT
KIT TURNOVER KIT A (KITS) ×1 IMPLANT
MANIFOLD NEPTUNE II (INSTRUMENTS) ×1 IMPLANT
MARKER SKIN DUAL TIP RULER LAB (MISCELLANEOUS) ×1 IMPLANT
MAT ABSORB FLUID 56X50 GRAY (MISCELLANEOUS) ×1 IMPLANT
NDL HYPO 21X1.5 SAFETY (NEEDLE) ×1 IMPLANT
NEEDLE HYPO 21X1.5 SAFETY (NEEDLE) ×1 IMPLANT
PACK TOTAL KNEE (MISCELLANEOUS) ×1 IMPLANT
PAD ARMBOARD POSITIONER FOAM (MISCELLANEOUS) ×3 IMPLANT
PAD COLD UNI WRAP-ON (PAD) ×1 IMPLANT
PENCIL SMOKE EVACUATOR (MISCELLANEOUS) ×1 IMPLANT
PIN DRILL HDLS TROCAR 75 4PK (PIN) IMPLANT
SCREW FEMALE HEX FIX 25X2.5 (ORTHOPEDIC DISPOSABLE SUPPLIES) IMPLANT
SCREW HEX HEADED 3.5X27 DISP (ORTHOPEDIC DISPOSABLE SUPPLIES) IMPLANT
SLEEVE SCD COMPRESS KNEE MED (STOCKING) ×1 IMPLANT
SOL .9 NS 3000ML IRR UROMATIC (IV SOLUTION) ×1 IMPLANT
SOLN STERILE WATER BTL 1000 ML (IV SOLUTION) ×1 IMPLANT
SOLUTION IRRIG SURGIPHOR (IV SOLUTION) ×1 IMPLANT
STOCKINETTE IMPERV 14X48 (MISCELLANEOUS) ×1 IMPLANT
SUT STRATAFIX 14 PDO 36 VLT (SUTURE) ×1 IMPLANT
SUT VIC AB 0 CT1 36 (SUTURE) ×1 IMPLANT
SUT VIC AB 2-0 CT2 27 (SUTURE) ×2 IMPLANT
SUTURE STRATA SPIR 4-0 18 (SUTURE) ×1 IMPLANT
SUTURE VICRYL 1-0 27IN ABS (SUTURE) ×1 IMPLANT
SYR 20ML LL LF (SYRINGE) ×2 IMPLANT
TAPE CLOTH 3X10 WHT NS LF (GAUZE/BANDAGES/DRESSINGS) ×1 IMPLANT
TIP FAN IRRIG PULSAVAC PLUS (DISPOSABLE) ×1 IMPLANT
TRAP FLUID SMOKE EVACUATOR (MISCELLANEOUS) ×1 IMPLANT

## 2023-12-11 NOTE — Progress Notes (Signed)
 Patient is not able to walk the distance required to go the bathroom, or she is unable to safely negotiate stairs required to access the bathroom.  A 3in1 BSC will alleviate this problem.       Lollie Marrow, PA-C [facility]

## 2023-12-11 NOTE — Plan of Care (Signed)
  Problem: Elimination: Goal: Will not experience complications related to urinary retention Outcome: Progressing   Problem: Pain Managment: Goal: General experience of comfort will improve and/or be controlled Outcome: Progressing

## 2023-12-11 NOTE — Discharge Summary (Signed)
 Physician Discharge Summary  Patient ID: Felicia Durham MRN: [id number] DOB/AGE: 1960-09-27 63 y.o.  Admit date: 12/11/2023 Discharge date: 12/12/2023  Admission Diagnoses:  Primary osteoarthritis of right knee [M17.11] S/P total knee arthroplasty, right [Z96.651]   Discharge Diagnoses: Patient Active Problem List   Diagnosis Date Noted   S/P total knee arthroplasty, right 12/11/2023   S/P TKR (total knee replacement), 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   Varicose veins of both lower extremities with pain 06/09/2019   Other microscopic hematuria 05/18/2016   Prediabetes 05/15/2016   Acquired hypothyroidism 03/28/2016   Anxiety 03/28/2016   Empty sella 03/28/2016   Essential hypertension 03/28/2016   Major depressive disorder, single episode, in partial remission 03/28/2016   Injury of tendon of long head of right biceps 12/30/2014   Synovitis of right shoulder 12/30/2014   Right supraspinatus tenosynovitis 01/31/2014   Fall with injury 01/26/2014    Past Medical History:  Diagnosis Date   Anxiety    Depression    GERD (gastroesophageal reflux disease)    Hypertension    Hypothyroidism    Pre-diabetes    Right supraspinatus tenosynovitis 01/31/2014   Synovitis of right shoulder 12/30/2014   Varicose veins of both lower extremities with pain 06/09/2019     Transfusion: none   Consultants (if any):   Discharged Condition: Improved  Hospital Course: Ana Woodroof is an 63 y.o. female who was admitted 12/11/2023 with a diagnosis of S/P total knee arthroplasty, right and went to the operating room on 12/11/2023 and underwent the above named procedures.    Surgeries: Procedure(s): ARTHROPLASTY, KNEE, TOTAL on 12/11/2023 Patient tolerated the surgery well. Taken to PACU where she was stabilized and then transferred to the orthopedic floor.  Started on Lovenox  30 mg q 12 hrs. TEDs and SCDs applied bilaterally. Heels elevated on bed. No evidence of DVT. Negative  Homan. Physical therapy started on day #1 for gait training and transfer. OT started day #1 for ADL and assisted devices.  Patient's IV was d/c on day #1. Patient was able to safely and independently complete all PT goals. PT recommending discharge to home.    On post op day #1 patient was stable and ready for discharge to home with HHPT.  Implants:  Femur: Persona Size 7 CR PPS   Tibia: Persona Size E OsseoTi  Poly: 12mm MC  Patella: 35x9.84mm symmetric OsseoTi   She was given perioperative antibiotics:  Anti-infectives (From admission, onward)    Start     Dose/Rate Route Frequency Ordered Stop   12/11/23 1400  ceFAZolin  (ANCEF ) IVPB 2g/100 mL premix        2 g 200 mL/hr over 30 Minutes Intravenous Every 6 hours 12/11/23 1332 12/11/23 2113   12/11/23 0615  ceFAZolin  (ANCEF ) IVPB 2g/100 mL premix        2 g 200 mL/hr over 30 Minutes Intravenous On call to O.R. 12/11/23 9385 12/11/23 0807     .  She was given sequential compression devices, early ambulation, and Lovenox  TEDs for DVT prophylaxis.  She benefited maximally from the hospital stay and there were no complications.    Recent vital signs:  Vitals:   12/12/23 0437 12/12/23 0805  BP: 131/79 (!) 131/90  Pulse: 69 72  Resp: 18 15  Temp: 98.1 F (36.7 C) 98 F (36.7 C)  SpO2: 96% 95%    Recent laboratory studies:  Lab Results  Component Value Date   HGB 10.9 (L) 12/12/2023   HGB 12.0 10/17/2023   HGB 11.3 (  L) 07/29/2023   Lab Results  Component Value Date   WBC 13.4 (H) 12/12/2023   PLT 251 12/12/2023   No results found for: INR Lab Results  Component Value Date   Felicia 139 12/12/2023   K 4.6 12/12/2023   CL 104 12/12/2023   CO2 27 12/12/2023   BUN 17 12/12/2023   CREATININE 0.62 12/12/2023   GLUCOSE 116 (H) 12/12/2023    Discharge Medications:   Allergies as of 12/12/2023   No Known Allergies      Medication List     TAKE these medications    acetaminophen  500 MG tablet Commonly known as:  TYLENOL  Tome 2 tabletas (1000 mg en total) por va oral cada 8 (ocho) horas. (Take 2 tablets (1,000 mg total) by mouth every 8 (eight) hours.)   celecoxib 200 MG capsule Commonly known as: CeleBREX Tome 1 cpsula (200 mg en total) por va oral 2 (dos) veces al da durante [address] (Take 1 capsule (200 mg total) by mouth 2 (two) times daily for 14 days.)   citalopram  20 MG tablet Commonly known as: CELEXA  Take 20 mg by mouth daily.   docusate sodium  100 MG capsule Commonly known as: Colace Tome 1 cpsula (100 mg en total) por va oral 2 (dos) veces al [facility]. (Take 1 capsule (100 mg total) by mouth 2 (two) times daily.)   enoxaparin  40 MG/0.4ML injection Commonly known as: LOVENOX  Inyectar 0,4 mL (40 mg en total) en la piel diariamente durante 14 das. (Inject 0.4 mLs (40 mg total) into the skin daily for 14 days.)   levothyroxine  100 MCG tablet Commonly known as: SYNTHROID  Take 100 mcg by mouth daily before breakfast.   olmesartan -hydrochlorothiazide  20-12.5 MG tablet Commonly known as: BENICAR  HCT Take 1 tablet by mouth daily.   ondansetron  4 MG tablet Commonly known as: ZOFRAN  Tome 1 tableta (4 mg en total) por va oral cada 6 (seis) horas segn sea necesario para las nuseas. (Take 1 tablet (4 mg total) by mouth every 6 (six) hours as needed for nausea.)   oxyCODONE  5 MG immediate release tablet Commonly known as: Roxicodone  Tome 0,5-1 tabletas (2,5-5 mg en total) por va oral cada 6 (seis) horas segn sea necesario para el dolor irruptivo. (Take 0.5-1 tablets (2.5-5 mg total) by mouth every 6 (six) hours as needed for breakthrough pain.)   traMADol  50 MG tablet Commonly known as: ULTRAM  Tome 1 tableta (50 mg en total) por va oral cada 6 (seis) horas segn sea necesario para el dolor moderado (puntuacin de dolor de 4 a 6). (Take 1 tablet (50 mg total) by mouth every 6 (six) hours as needed for moderate pain (pain score 4-6).)               Durable Medical  Equipment  (From admission, onward)           Start     Ordered   12/11/23 1452  For home use only DME 3 n 1  Once        12/11/23 1451   12/11/23 1451  For home use only DME Walker rolling  Once       Question Answer Comment  Walker: With 5 Inch Wheels   Patient needs a walker to treat with the following condition Total knee replacement status      12/11/23 1451            Diagnostic Studies: DG Knee Right Port Result Date: 12/11/2023 CLINICAL DATA:  Status post  total knee replacement. EXAM: PORTABLE RIGHT KNEE - 1-2 VIEW COMPARISON:  CT right femur October 28, 2023. FINDINGS: Status post right total knee arthroplasty with normal alignment. No acute fracture. Expected postoperative soft tissue swelling and air anteriorly. IMPRESSION: 1. Status post right total knee arthroplasty with normal alignment. Electronically Signed   By: Harrietta Sherry M.D.   On: 12/11/2023 10:44    Disposition:      Follow-up Information     Charlene Debby BROCKS, PA-C Follow up in 2 week(s).   Specialties: Orthopedic Surgery, Emergency Medicine Contact information: [address] [place] [address] [phone]                  Signed: Debby BROCKS Charlene 12/12/2023, 8:08 AM

## 2023-12-11 NOTE — Discharge Instructions (Signed)

## 2023-12-11 NOTE — Evaluation (Signed)
 Physical Therapy Evaluation Patient Details Name: Felicia Durham MRN: [id number] DOB: 1960-07-03 Today's Date: 12/11/2023  History of Present Illness  Pt is a 63 y/o F s/p total knee arthroplasty on R side.  Clinical Impression  Patient noted to be in supine position at PT arrival in room, for an initial PT evaluation due to a decline in functional status, with baseline mobility reported as modI, and currently requiring minA for transfers and ambulation. The patient is A&O x 4, presenting with good willingness to work with PT and goals of getting better, with discharge expectations that include HHPT. The patient resides in a house and lives spouse with family/friend support. Pt has ramped entrance to enter and inside the residence.  Gait was assessed with RW, limited by expected limitations sp joint replacement. Gait mechanic observations noted increased limping/weight acceptance on RLE and decreased step length. The overall clinical impression is that the patient presents with mild mobility limitations secondary to total joint replacement. Recommended skilled PT will address safety, mobility, and discharge planning. PT recommendation to d/c patient to HHPT upon medical clearance.        If plan is discharge home, recommend the following: A little help with walking and/or transfers;A little help with bathing/dressing/bathroom;Assist for transportation;Help with stairs or ramp for entrance   Can travel by private vehicle        Equipment Recommendations None recommended by PT  Recommendations for Other Services       Functional Status Assessment Patient has had a recent decline in their functional status and/or demonstrates limited ability to make significant improvements in function in a reasonable and predictable amount of time     Precautions / Restrictions Precautions Precautions: Fall Restrictions RLE Weight Bearing Per Provider Order: Weight bearing as tolerated       Mobility  Bed Mobility Overal bed mobility: Needs Assistance Bed Mobility: Supine to Sit     Supine to sit: Contact guard, Min assist          Transfers Overall transfer level: Needs assistance Equipment used: Rolling walker (2 wheels) Transfers: Sit to/from Stand Sit to Stand: Contact guard assist, Min assist                Ambulation/Gait Ambulation/Gait assistance: Min assist, Contact guard assist Gait Distance (Feet): 250 Feet Assistive device: Rolling walker (2 wheels) Gait Pattern/deviations: Step-through pattern, Decreased step length - right, Decreased stance time - right, Trunk flexed, Narrow base of support       General Gait Details: increased limping and impaired weight acceptance on R side  Stairs Stairs: Yes Stairs assistance: Min assist, Contact guard assist Stair Management: Two rails Number of Stairs: 8    Wheelchair Mobility     Tilt Bed    Modified Rankin (Stroke Patients Only)       Balance Overall balance assessment: Needs assistance Sitting-balance support: Feet supported Sitting balance-Leahy Scale: Normal     Standing balance support: During functional activity, Reliant on assistive device for balance Standing balance-Leahy Scale: Fair                               Pertinent Vitals/Pain Pain Assessment Pain Assessment: Faces Faces Pain Scale: Hurts little more Pain Location: R knee Pain Descriptors / Indicators: Aching, Tightness Pain Intervention(s): Limited activity within patient's tolerance, Monitored during session, Repositioned    Home Living Family/patient expects to be discharged to:: Private residence Living Arrangements: Spouse/significant other  Available Help at Discharge: Family Type of Home: House Home Access: Ramped entrance       Home Layout: One level Home Equipment: Firefighter      Prior Function Prior Level of Function : Working/employed;Driving                      Extremity/Trunk Assessment   Upper Extremity Assessment Upper Extremity Assessment: Defer to OT evaluation    Lower Extremity Assessment Lower Extremity Assessment: Generalized weakness;RLE deficits/detail RLE Deficits / Details: R TKA; limited motion and strength knee extension and weight acceptance    Cervical / Trunk Assessment Cervical / Trunk Assessment: Normal  Communication   Communication Communication: No apparent difficulties    Cognition Arousal: Alert Behavior During Therapy: WFL for tasks assessed/performed   PT - Cognitive impairments: No apparent impairments                         Following commands: Intact       Cueing Cueing Techniques: Verbal cues     General Comments      Exercises Total Joint Exercises Ankle Circles/Pumps: AROM, Right, 10 reps Quad Sets: AROM, Right, 10 reps Heel Slides: AROM, Right, 10 reps Hip ABduction/ADduction: AROM, Right, 5 reps Straight Leg Raises: AROM, Right, 5 reps Long Arc Quad: AROM, Both, 10 reps Goniometric ROM: 96   Assessment/Plan    PT Assessment Patient needs continued PT services  PT Problem List Decreased strength;Decreased range of motion;Decreased activity tolerance;Decreased balance;Decreased mobility;Pain       PT Treatment Interventions DME instruction;Gait training;Stair training;Functional mobility training;Therapeutic activities;Therapeutic exercise;Manual techniques;Patient/family education;Neuromuscular re-education;Balance training    PT Goals (Current goals can be found in the Care Plan section)  Acute Rehab PT Goals Patient Stated Goal: Pt wants to get better PT Goal Formulation: With patient Time For Goal Achievement: 12/25/23 Potential to Achieve Goals: Good    Frequency Min 2X/week     Co-evaluation               AM-PAC PT 6 Clicks Mobility  Outcome Measure Help needed turning from your back to your side while in a flat bed without using bedrails?: A  Little Help needed moving from lying on your back to sitting on the side of a flat bed without using bedrails?: A Little Help needed moving to and from a bed to a chair (including a wheelchair)?: A Little Help needed standing up from a chair using your arms (e.g., wheelchair or bedside chair)?: A Little Help needed to walk in hospital room?: A Little Help needed climbing 3-5 steps with a railing? : A Little 6 Click Score: 18    End of Session Equipment Utilized During Treatment: Gait belt Activity Tolerance: Patient tolerated treatment well;Patient limited by pain Patient left: in bed;with bed alarm set;with call bell/phone within reach Nurse Communication: Mobility status PT Visit Diagnosis: Other abnormalities of gait and mobility (R26.89);Muscle weakness (generalized) (M62.81);Pain Pain - Right/Left: Right Pain - part of body: Knee    Time: 1500-1540 PT Time Calculation (min) (ACUTE ONLY): 40 min   Charges:   PT Evaluation $PT Eval Low Complexity: 1 Low   PT General Charges $$ ACUTE PT VISIT: 1 Visit         Sherlean Lesches DPT, PT    Stirling Orton A Anniebelle Devore 12/11/2023, 3:55 PM

## 2023-12-11 NOTE — H&P (Signed)
 History of Present Illness: The patient is an 63 y.o. female seen in clinic today for follow-up evaluation of her left knee status post left total knee replacement. And repeat evaluation right knee prior to planned right total knee replacement. Patient reports her left knee is doing fantastic and has no pain. Reports her right knee is causing significant pain and discomfort limiting her ongoing recovery and function. She is function limited on a daily basis due to her right knee with pain at rest today to 5 out of 10 up to a 10 out of 10 and is worse with certain activities such as walking going up and down stairs and out of a chair or car. She has had conservative treatments for both of her knees in the past and is here to discuss moving forward with right total knee replacement. The patient denies fevers, chills, numbness, tingling, shortness of breath, chest pain, recent illness, or any trauma.  Past Medical History: Past Medical History:  Diagnosis Date  Depression  Hypertension  Thyroid  disorder   Past Surgical History: Past Surgical History:  Procedure Laterality Date  extensive arthroscopic debridement, arthroscopic subacromial decompression, and a mini-open biceps tendoesis, right shoulder Right 12/27/2014  Dr.Poggi  ARTHROPLASTY TOTAL KNEE Left 07/28/2023  By Dr. Lorelle  HYSTERECTOMY VAGINAL   Past Family History: Family History  Problem Relation Age of Onset  No Known Problems Mother  Alcohol abuse Father  Myocardial Infarction (Heart attack) Sister  No Known Problems Sister  No Known Problems Sister  Epilepsy Brother   Medications: Current Outpatient Medications  Medication Sig Dispense Refill  cholecalciferol (VITAMIN D3) 2,000 unit tablet Take 2,000 Units by mouth once daily  citalopram  (CELEXA ) 20 MG tablet Take 1 tablet (20 mg total) by mouth once daily for 60 days 90 tablet 1  ketoconazole (NIZORAL) 2 % cream Apply topically once daily 60 g 1  levothyroxine   (SYNTHROID ) 100 MCG tablet Take 1 tablet (100 mcg total) by mouth once daily 90 tablet 1  methocarbamoL (ROBAXIN) 500 MG tablet Take 1 tablet (500 mg total) by mouth 4 (four) times daily 60 tablet 0  olmesartan -hydroCHLOROthiazide  (BENICAR  HCT) 20-12.5 mg tablet Take 1 tablet by mouth once daily 90 tablet 1  oxyCODONE  (ROXICODONE ) 5 MG immediate release tablet Take 1 tablet (5 mg total) by mouth every 4 (four) hours as needed 30 tablet 0  tirzepatide (ZEPBOUND) 2.5 mg/0.5 mL pen injector Inject 0.5 mLs (2.5 mg total) subcutaneously every 7 (seven) days 2 mL 2   No current facility-administered medications for this visit.   Allergies: No Known Allergies   Visit Vitals: There were no vitals filed for this visit.   Review of Systems:  A comprehensive 14 point ROS was performed, reviewed, and the pertinent orthopaedic findings are documented in the HPI.  Physical Exam: General/Constitutional: No apparent distress: well-nourished and well developed. Eyes: Pupils equal, round with synchronous movement. Pulmonary exam: Lungs clear to auscultation bilaterally no wheezing rales or rhonchi Cardiac exam: Regular rate and rhythm no obvious murmurs rubs or gallops. Integumentary: No impressive skin lesions present, except as noted in detailed exam. Neuro/Psych: Normal mood and affect, oriented to person, place and time.   Comprehensive Knee Exam: Gait Antalgic on the right  Alignment Neutral   Inspection Right  Skin Normal appearance with no obvious deformity. No ecchymosis or erythema.  Soft Tissue No focal soft tissue swelling  Quad Atrophy None   Palpation  Right  Tenderness Medial joint line tenderness to palpation  Crepitus +  patellofemoral and tibiofemoral crepitus  Effusion None   Range of Motion Right  Flexion 0-105  Extension Full knee extension without hyperextension   Ligamentous Exam Right  Lachman Normal  Valgus 0 Normal  Valgus 30 Normal  Varus 0 Normal  Varus 30  Normal  Anterior Drawer Normal  Posterior Drawer Normal   Meniscal Exam Right  Hyperflexion Test Positive  Hyperextension Test Positive  McMurray's Positive   Neurovascular Right  Quadriceps Strength 5/5  Hamstring Strength 5/5  Hip Abductor Strength 4/5  Distal Motor Normal  Distal Sensory Normal light touch sensation  Distal Pulses Normal      Left lower extremity Skin intact over the knee with a well-healed surgical incision no erythema swelling or fluctuance Knee is stable to range of motion 0 to 120 degrees of flexion Neurovascular intact distally with soft compartments  Imaging Studies: AP, lateral and sunrise of the left knee and AP and sunrise of the right knee ordered and taken today in clinic images reviewed myself which show status post left total knee replacement components in appropriate position with no evidence of periprosthetic fracture or loosening. The right knee shows redemonstration of severe tricompartmental degenerative changes with bone-on-bone articulation medially and on the patellofemoral joint with osteophyte formation sclerosis and cystic changes. Kellgren-Lawrence grade 4. Lateral image of the right knee from May of this year also reviewed which shows similar degenerative changes. No fractures or dislocations noted.  Assessment:  ICD-10-CM  1. Status post total left knee replacement Z96.652    Plan: Micaela is a 63 year old female presents with right knee bone on bone arthritis. Based upon the patient's continued symptoms and failure to respond to conservative treatment, I have recommended a right total knee replacement for this patient. A long discussion took place with the patient describing what a total joint replacement is and what the procedure would entail. A knee model, similar to the implants that will be used during the operation, was utilized to demonstrate the implants. Choices of implant manufactures were discussed and reviewed. The ability  to secure the implant utilizing cement or cementless (press fit) fixation was discussed. The approach and exposure was discussed.   The hospitalization and post-operative care and rehabilitation were also discussed. The use of perioperative antibiotics and DVT prophylaxis were discussed. The risk, benefits and alternatives to a surgical intervention were discussed at length with the patient. The patient was also advised of risks related to the medical comorbidities and elevated body mass index (BMI). A lengthy discussion took place to review the most common complications including but not limited to: stiffness, loss of function, complex regional pain syndrome, deep vein thrombosis, pulmonary embolus, heart attack, stroke, infection, wound breakdown, numbness, intraoperative fracture, damage to nerves, tendon,muscles, arteries or other blood vessels, death and other possible complications from anesthesia. The patient was told that we will take steps to minimize these risks by using sterile technique, antibiotics and DVT prophylaxis when appropriate and follow the patient postoperatively in the office setting to monitor progress. The possibility of recurrent pain, no improvement in pain and actual worsening of pain were also discussed with the patient.   Patient asked about and confirms no history of any reactions to metal or metal allergy in the past.  The discharge plan of care focused on the patient going home following surgery. The patient was encouraged to make the necessary arrangements to have someone stay with them when they are discharged home.   The benefits of surgery were discussed with the  patient including the potential for improving the patient's current clinical condition through operative intervention. Alternatives to surgical intervention including continued conservative management were also discussed in detail. All questions were answered to the satisfaction of the patient. The patient  participated and agreed to the plan of care as well as the use of the recommended implants for their total knee replacement surgery. An information packet was given to the patient to review prior to surgery.   Patient received clearance for surgery. All questions answered and she agrees above plan make preparations for a right total knee replacement.  Patient had a negative urine culture from 10/31/2023 showing clearance of her urinary tract infection.   Portions of this record have been created using Scientist, clinical (histocompatibility and immunogenetics). Dictation errors have been sought, but may not have been identified and corrected.  Arthea Sheer MD

## 2023-12-11 NOTE — Op Note (Signed)
 Patient Name: Felicia Durham  FMW:[id number]  Pre-Operative Diagnosis: Right knee Osteoarthritis  Post-Operative Diagnosis: (same)  Procedure: Right Total Knee Arthroplasty  Components/Implants: Femur: Persona Size 7 CR PPS   Tibia: Persona Size E OsseoTi  Poly: 12mm MC  Patella: 35x9.35mm symmetric OsseoTi  Femoral Valgus Cut Angle: 5 degrees  Distal Femoral Re-cut: no  Patella Resurfacing: yes   Date of Surgery: 12/11/2023  Surgeon: Arthea Sheer MD  Assistant: Debby Amber PA (present and scrubbed throughout the case, critical for assistance with exposure, retraction, instrumentation, and closure)   Anesthesiologist: Vicci  Anesthesia: Spinal   Tourniquet Time: 36 min  EBL: 75cc  IVF: 400cc  Complications: None   Brief history: The patient is a 63 year old female with a history of osteoarthritis of the right knee with pain limiting their range of motion and activities of daily living, which has failed multiple attempts at conservative therapy.  The risks and benefits of total knee arthroplasty as definitive surgical treatment were discussed with the patient, who opted to proceed with the operation.  After outpatient medical clearance and optimization was completed the patient was admitted to [facility] for the procedure.  All preoperative films were reviewed and an appropriate surgical plan was made prior to surgery. Preoperative range of motion was 0 to 105.   Description of procedure: The patient was brought to the operating room where laterality was confirmed by all those present to be the right side.   Spinal anesthesia was administered and the patient received an intravenous dose of antibiotics for surgical prophylaxis and a dose of tranexamic acid .  Patient is positioned supine on the operating room table with all bony prominences well-padded.  A well-padded tourniquet was applied to the right thigh.  The knee was then prepped and draped in  usual sterile fashion with multiple layers of adhesive and nonadhesive drapes.  All of those present in the operating room participated in a surgical timeout laterality and patient were confirmed.   An Esmarch was wrapped around the extremity and the leg was elevated and the knee flexed.  The tourniquet was inflated to a pressure of 250 mmHg. The Esmarch was removed and the leg was brought down to full extension.  The patella and tibial tubercle identified and outlined using a marking pen and a midline skin incision was made with a knife carried through the subcutaneous tissue down to the extensor retinaculum.  After exposure of the extensor mechanism the medial parapatellar arthrotomy was performed with a scalpel and electrocautery extending down medial and distal to the tibial tubercle taking care to avoid incising the patellar tendon.   A standard medial release was performed over the proximal tibia.  The knee was brought into extension in order to excise the fat pad taking care not to damage the patella tendon.  The superior soft tissue was removed from the anterior surface of the distal femur to visualize for the procedure.  The knee was then brought into flexion with the patella subluxed laterally and subluxing the tibia anteriorly.  The ACL was transected and removed with electrocautery and additional soft tissue was removed from the proximal surface of the tibia to fully expose. The PCL was found to be intact and was preserved.  An extramedullary tibial cutting guide was then applied to the leg with a spring-loaded ankle clamp placed around the distal tibia just above the malleoli the angulation of the guide was adjusted to give some posterior slope in the tibial resection with an  appropriate varus/valgus alignment.  The resection guide was then pinned to the proximal tibia and the proximal tibial surface was resected with an oscillating saw.  Careful attention was paid to ensure the blade did not  disrupt any of the soft tissues including any lateral or medial ligament.  Attention was then turned to the femur, with the knee slightly flexed a opening drill was used to enter the medullary canal of the femur.  After removing the drill marrow was suctioned out to decompress the distal femur.  An intramedullary femoral guide was then inserted into the drill hole and the alignment guide was seated firmly against the distal end of the medial femoral condyle.  The distal femoral cutting guide was then attached and pinned securely to the anterior surface of the femur and the intramedullary rod and alignment guide was removed.  Distal femur resection was then performed with an oscillating saw with retractors protecting medial and laterally.   The distal cutting block was then removed and the extension gap was checked with a spacer.  Extension gap was found to be appropriately sized to accommodate the spacer block.   The femoral sizing guide was then placed securely into the posterior condyles of the femur and the femoral size was measured and determined to be 7.  The size 7; 4-in-1 cutting guide was placed in position and secured with 2 pins.  The anterior posterior and chamfer resections were then performed with an oscillating saw.  Bony fragments and osteophytes were then removed.  Using a lamina spreader the posterior medial and lateral condyles were checked for additional osteophytes and posterior soft tissue remnants.  Any remaining meniscus was removed at this time.  Periarticular injection was performed in the meniscal rims and posterior capsule with aspiration performed to ensure no intravascular injection.   The tibia was then exposed and the tibial trial was pinned onto the plateau after confirming appropriate orientation and rotation.  Using the drill bushing the tibia was prepared to the appropriate drill depth.  Tibial broach impactor was then driven through the punch guide using a mallet.  The  femoral trial component was then inserted onto the femur.  A trial tibial polyethylene bearing was then placed and the knee was reduced.  The knee achieved full extension with no hyperextension and was found to be balanced in flexion and extension with the trials in place.  The knee was then brought into full extension the patella was everted and held with 2 Kocher clamps.  The articular surface of the patella was then resected with an patella reamer and saw after careful measurement with a caliper.  The patella was then prepared with the drill guide and a trial patella was placed.  The knee was then taken through range of motion and it was found that the patella articulated appropriately with the trochlea and good patellofemoral motion without subluxation.    The correct final components for implantation were confirmed and opened by the circulator nurse.  The knee was irrigated with normal saline via pulsatile lavage to remove any bony debris or soft tissue.  The prepared surface of the tibia was exposed and the tibial component was implanted with good bony contact.  The femoral component was then placed and impacted showing good coverage and a good snug fit.  The patella was then cleared off and the patella compression tool was used to apply the patellar component with symmetric compression onto the patella.  The tibial component was then irrigated and  cleared of any debris and a real polyethylene component was placed and engaged with the locking mechanism.  The knee was then injected with the particular cocktail.  The knee was taken through range of motion and found to be stable in flexion and extension with patellar tracking.  The knee was then irrigated with copious amount of normal saline via pulsatile lavage to remove all loose bodies and other debris.  The knee was then irrigated with surgiphor betadine based wash and reirrigated with saline.  The tourniquet was then dropped and all bleeding vessels were  identified and coagulated.  The arthrotomy was approximated with #1 Vicryl and closed with #1 Stratafix suture.  The knee was brought into slight flexion and the subcutaneous tissues were closed with 0 Vicryl, 2-0 Vicryl and a running subcuticular 4-0 stratafix barbed suture.  Skin was then glued with Dermabond.  A sterile adhesive dressing was then placed along with a sequential compression device to the calf, a Ted stocking, and a cryotherapy cuff.   Sponge, needle, and Lap counts were all correct at the end of the case.   The patient was transferred off of the operating room table to a hospital bed, good pulses were found distally on the operative side.  The patient was transferred to the recovery room in stable condition.

## 2023-12-11 NOTE — Interval H&P Note (Signed)
Patient history and physical updated. Consent reviewed including risks, benefits, and alternatives to surgery. Patient agrees with above plan to proceed with right total knee arthroplasty  

## 2023-12-11 NOTE — Transfer of Care (Signed)
 Immediate Anesthesia Transfer of Care Note  Patient: Felicia Durham  Procedure(s) Performed: ARTHROPLASTY, KNEE, TOTAL (Right: Knee)  Patient Location: PACU  Anesthesia Type:Spinal  Level of Consciousness: awake, alert , and oriented  Airway & Oxygen Therapy: Patient Spontanous Breathing  Post-op Assessment: Report given to RN and Post -op Vital signs reviewed and stable  Post vital signs: Reviewed and stable  Last Vitals:  Vitals Value Taken Time  BP 96/56 12/11/23 09:01  Temp    Pulse 82 12/11/23 09:05  Resp 16 12/11/23 09:05  SpO2 94 % 12/11/23 09:05  Vitals shown include unfiled device data.  Last Pain:  Vitals:   12/11/23 0627  TempSrc: Tympanic  PainSc: 4          Complications: No notable events documented.

## 2023-12-11 NOTE — Anesthesia Procedure Notes (Addendum)
 Spinal  Patient location during procedure: OR Start time: 12/11/2023 7:20 AM End time: 12/11/2023 7:27 AM Reason for block: surgical anesthesia Staffing Performed: resident/CRNA  Anesthesiologist: Vicci Camellia Glatter, MD Resident/CRNA: Helen Cuff, CRNA Performed by: Shante Archambeault, CRNA Authorized by: Vicci Camellia Glatter, MD   Preanesthetic Checklist Completed: patient identified, IV checked, site marked, risks and benefits discussed, surgical consent, monitors and equipment checked, pre-op evaluation and timeout performed Spinal Block Patient position: sitting Prep: DuraPrep Patient monitoring: heart rate, cardiac monitor, continuous pulse ox and blood pressure Approach: midline Location: L3-4 Injection technique: single-shot Needle Needle type: Pencan  Needle gauge: 24 G Needle length: 9 cm Assessment Sensory level: T10 Events: CSF return Additional Notes Patient sitting up at edge of the bed with staff assistance. Cleaned back with Duraprep and used sterile technique throughout the procedure. L3-4 with one attempt, free, clear CSF flow. Patient tolerated well without complications. No paraesthesia. Vital signs stable.

## 2023-12-12 ENCOUNTER — Other Ambulatory Visit: Payer: Self-pay

## 2023-12-12 ENCOUNTER — Encounter: Payer: Self-pay | Admitting: Orthopedic Surgery

## 2023-12-12 DIAGNOSIS — M1711 Unilateral primary osteoarthritis, right knee: Secondary | ICD-10-CM | POA: Diagnosis not present

## 2023-12-12 LAB — CBC
HCT: 33.1 % — ABNORMAL LOW (ref 36.0–46.0)
Hemoglobin: 10.9 g/dL — ABNORMAL LOW (ref 12.0–15.0)
MCH: 29.7 pg (ref 26.0–34.0)
MCHC: 32.9 g/dL (ref 30.0–36.0)
MCV: 90.2 fL (ref 80.0–100.0)
Platelets: 251 K/uL (ref 150–400)
RBC: 3.67 MIL/uL — ABNORMAL LOW (ref 3.87–5.11)
RDW: 13.6 % (ref 11.5–15.5)
WBC: 13.4 K/uL — ABNORMAL HIGH (ref 4.0–10.5)
nRBC: 0 % (ref 0.0–0.2)

## 2023-12-12 LAB — BASIC METABOLIC PANEL WITH GFR
Anion gap: 8 (ref 5–15)
BUN: 17 mg/dL (ref 8–23)
CO2: 27 mmol/L (ref 22–32)
Calcium: 8.9 mg/dL (ref 8.9–10.3)
Chloride: 104 mmol/L (ref 98–111)
Creatinine, Ser: 0.62 mg/dL (ref 0.44–1.00)
GFR, Estimated: >60 mL/min (ref >60)
Glucose, Bld: 116 mg/dL — ABNORMAL HIGH (ref 70–99)
Potassium: 4.6 mmol/L (ref 3.5–5.1)
Sodium: 139 mmol/L (ref 135–145)

## 2023-12-12 MED ORDER — HYDROCHLOROTHIAZIDE 25 MG PO TABS
ORAL_TABLET | ORAL | Status: AC
  Filled 2023-12-12: qty 1

## 2023-12-12 NOTE — Plan of Care (Signed)
 Documented

## 2023-12-12 NOTE — Progress Notes (Signed)
   Subjective: 1 Day Post-Op Procedure(s) (LRB): ARTHROPLASTY, KNEE, TOTAL (Right) Patient reports pain as mild.   Patient is well, and has had no acute complaints or problems Denies any CP, SOB, ABD pain. We will continue therapy today.  Plan is to go Home after hospital stay.  Objective: Vital signs in last 24 hours: Temp:  [96.9 F (36.1 C)-98.3 F (36.8 C)] 98.1 F (36.7 C) (11/14 0437) Pulse Rate:  [54-99] 69 (11/14 0437) Resp:  [13-23] 18 (11/14 0437) BP: (96-140)/(56-94) 131/79 (11/14 0437) SpO2:  [92 %-98 %] 96 % (11/14 0437)  Intake/Output from previous day: 11/13 0701 - 11/14 0700 In: 2126.4 [P.O.:300; I.V.:1405; IV Piggyback:421.4] Out: 75 [Blood:75] Intake/Output this shift: No intake/output data recorded.  Recent Labs    12/12/23 0624  HGB 10.9*   Recent Labs    12/12/23 0624  WBC 13.4*  RBC 3.67*  HCT 33.1*  PLT 251   Recent Labs    12/12/23 0624  NA 139  K 4.6  CL 104  CO2 27  BUN 17  CREATININE 0.62  GLUCOSE 116*  CALCIUM 8.9   No results for input(s): LABPT, INR in the last 72 hours.  EXAM General - Patient is Alert, Appropriate, and Oriented Extremity - Neurovascular intact Sensation intact distally Intact pulses distally Dorsiflexion/Plantar flexion intact Dressing - dressing C/D/I and no drainage Motor Function - intact, moving foot and toes well on exam.   Past Medical History:  Diagnosis Date   Anxiety    Depression    GERD (gastroesophageal reflux disease)    Hypertension    Hypothyroidism    Pre-diabetes    Right supraspinatus tenosynovitis 01/31/2014   Synovitis of right shoulder 12/30/2014   Varicose veins of both lower extremities with pain 06/09/2019    Assessment/Plan:   1 Day Post-Op Procedure(s) (LRB): ARTHROPLASTY, KNEE, TOTAL (Right) Principal Problem:   S/P total knee arthroplasty, right  Estimated body mass index is 38.96 kg/m as calculated from the following:   Height as of this encounter: 5'  2 (1.575 m).   Weight as of this encounter: 96.6 kg. Advance diet Up with therapy Pain well controlled Labs and VSS CM to assist with discharge to home with HHPT today  DVT Prophylaxis - Lovenox , TED hose, and SCDs Weight-Bearing as tolerated to right leg   T. Medford Amber, PA-C [facility] 12/12/2023, 8:03 AM

## 2023-12-12 NOTE — Anesthesia Postprocedure Evaluation (Signed)
 Anesthesia Post Note  Patient: Felicia Durham  Procedure(s) Performed: ARTHROPLASTY, KNEE, TOTAL (Right: Knee)  Patient location during evaluation: PACU Anesthesia Type: Spinal Level of consciousness: awake and alert Pain management: pain level controlled Vital Signs Assessment: post-procedure vital signs reviewed and stable Respiratory status: spontaneous breathing, nonlabored ventilation and respiratory function stable Cardiovascular status: blood pressure returned to baseline and stable Postop Assessment: no apparent nausea or vomiting and spinal receding Anesthetic complications: no   No notable events documented.   Last Vitals:  Vitals:   12/12/23 0437 12/12/23 0805  BP: 131/79 (!) 131/90  Pulse: 69 72  Resp: 18 15  Temp: 36.7 C 36.7 C  SpO2: 96% 95%    Last Pain:  Vitals:   12/12/23 1000  TempSrc:   PainSc: 3                  [name]

## 2023-12-12 NOTE — Progress Notes (Signed)
 Discharge Summary for [facility]  Discharge Plan: Patient will be discharged home as per the MD's order. We discussed prescriptions and follow-up appointments with the patient. The prescriptions were provided, and the medication list was explained in detail. Patient confirmed understanding of the instructions.  Skin Assessment: The patient's skin is clean, dry, and intact, with no signs of breakdown or tears. The IV catheter was removed, and the skin remains intact. The site shows no signs or symptoms of complications. A dressing and pressure were applied to the site. Patient reports no pain and has no complaints.  After-Visit Summary: An After-Visit Summary was printed and given to the patient. The following items were sent home with patient:  Polar care with power supply  Two honeycomb bandages Two TED hose placed on both patient legs  The patient was escorted via wheelchair and discharged home in a private vehicle.  Kadi Hession D. Geofm, RN

## 2023-12-12 NOTE — Progress Notes (Signed)
 Physical Therapy Treatment Patient Details Name: Felicia Durham MRN: [id number] DOB: 1960-09-10 Today's Date: 12/12/2023   History of Present Illness Pt is a 63 y/o F s/p total knee arthroplasty on R side.    PT Comments  Arrived to pt supine in bed with HOB raised 20 deg. Pt stated that she had surgery on her other knee previously. Pt appeared A and O x4 and was ready to move around and do exercises. Pt performed supine to sit on EOB with some cueing and HHA. Pt performed sit to stand to RW with CGA. Ambulated 250 ft with a RW with a step through pattern and consistent cadence. Reported having very little pain during ambulation. Performed 8 stairs with both rails CGA. Needed to be reminded to only do one step at a time. Performed stand to sit transfer into recliner with CGA. Measured knee flexion to be 112 deg. Provided HEP packet and went over the exercises. Discharge recs still appropriate. Continue with POC.   If plan is discharge home, recommend the following: A little help with walking and/or transfers;A little help with bathing/dressing/bathroom;Assist for transportation;Help with stairs or ramp for entrance   Can travel by private vehicle        Equipment Recommendations  None recommended by PT    Recommendations for Other Services       Precautions / Restrictions Precautions Precautions: Fall Recall of Precautions/Restrictions: Intact Restrictions Weight Bearing Restrictions Per Provider Order: No RLE Weight Bearing Per Provider Order: Weight bearing as tolerated     Mobility  Bed Mobility Overal bed mobility: Needs Assistance Bed Mobility: Supine to Sit     Supine to sit: Contact guard, Min assist     General bed mobility comments: Some cueing and HHA to move to EOB    Transfers Overall transfer level: Needs assistance Equipment used: Rolling walker (2 wheels) Transfers: Sit to/from Stand Sit to Stand: Contact guard assist, Supervision            General transfer comment: STS to RW    Ambulation/Gait Ambulation/Gait assistance: Contact guard assist, Supervision Gait Distance (Feet): 250 Feet Assistive device: Rolling walker (2 wheels) Gait Pattern/deviations: Step-through pattern, Decreased step length - right, Decreased stance time - right, Narrow base of support       General Gait Details: Step through pattern. Monitored pain during ambulation. Said she had very little pain.   Stairs Stairs: Yes Stairs assistance: Contact guard assist Stair Management: Two rails Number of Stairs: 8 General stair comments: Some cueing and reminding to only do one step at a time. Was able to recall going up with good, down with bad       Balance Overall balance assessment: Needs assistance Sitting-balance support: Feet supported Sitting balance-Leahy Scale: Normal     Standing balance support: During functional activity, Reliant on assistive device for balance Standing balance-Leahy Scale: Good Standing balance comment: Good standing balance bedside with RW          Communication Communication Communication: No apparent difficulties  Cognition Arousal: Alert Behavior During Therapy: WFL for tasks assessed/performed   PT - Cognitive impairments: No apparent impairments      Following commands: Intact      Cueing Cueing Techniques: Verbal cues  Exercises Total Joint Exercises Ankle Circles/Pumps: AROM, Right, 10 reps Heel Slides: AROM, Right, 10 reps Long Arc Quad: AROM, Both, 10 reps Goniometric ROM: 112    General Comments General comments (skin integrity, edema, etc.): Measured knee flexion to be 112  deg      Pertinent Vitals/Pain Pain Assessment Pain Assessment: 0-10 Pain Score: 2  Pain Location: R knee Pain Descriptors / Indicators: Aching, Tightness Pain Intervention(s): Monitored during session, Premedicated before session, Ice applied     PT Goals (current goals can now be found in the care plan  section) Acute Rehab PT Goals Patient Stated Goal: Pt wants to get better Progress towards PT goals: Progressing toward goals    Frequency    Min 2X/week       AM-PAC PT 6 Clicks Mobility   Outcome Measure  Help needed turning from your back to your side while in a flat bed without using bedrails?: A Little Help needed moving from lying on your back to sitting on the side of a flat bed without using bedrails?: A Little Help needed moving to and from a bed to a chair (including a wheelchair)?: A Little Help needed standing up from a chair using your arms (e.g., wheelchair or bedside chair)?: A Little Help needed to walk in hospital room?: A Little Help needed climbing 3-5 steps with a railing? : A Little 6 Click Score: 18    End of Session Equipment Utilized During Treatment: Gait belt Activity Tolerance: Patient tolerated treatment well Patient left: in chair;with call bell/phone within reach;with chair alarm set Nurse Communication: Mobility status PT Visit Diagnosis: Other abnormalities of gait and mobility (R26.89);Muscle weakness (generalized) (M62.81);Pain Pain - Right/Left: Right Pain - part of body: Knee     Time: 9165-9141 PT Time Calculation (min) (ACUTE ONLY): 24 min  Charges:    $Gait Training: 8-22 mins $Therapeutic Exercise: 8-22 mins PT General Charges $$ ACUTE PT VISIT: 1 Visit                     Signe Mickie Kozikowski SPTA    Ellianne Gowen 12/12/2023, 9:45 AM
